# Patient Record
Sex: Male | Born: 1970 | Race: White | Hispanic: No | Marital: Married | Smoking: Former smoker
Health system: Southern US, Community
[De-identification: ages and names within clinical notes are randomized; demographics above are authoritative.]

## PROBLEM LIST (undated history)

## (undated) DIAGNOSIS — I10 Essential (primary) hypertension: Secondary | ICD-10-CM

## (undated) DIAGNOSIS — N529 Male erectile dysfunction, unspecified: Secondary | ICD-10-CM

## (undated) HISTORY — PX: NO PAST SURGERIES: SHX2092

## (undated) HISTORY — DX: Male erectile dysfunction, unspecified: N52.9

## (undated) HISTORY — DX: Essential (primary) hypertension: I10

---

## 2016-03-06 ENCOUNTER — Ambulatory Visit (INDEPENDENT_AMBULATORY_CARE_PROVIDER_SITE_OTHER): Payer: Self-pay | Admitting: Internal Medicine

## 2016-03-06 VITALS — BP 153/113 | HR 75 | Temp 98.4°F | Resp 20 | Ht 73.0 in | Wt 191.8 lb

## 2016-03-06 DIAGNOSIS — R030 Elevated blood-pressure reading, without diagnosis of hypertension: Secondary | ICD-10-CM

## 2016-03-06 DIAGNOSIS — IMO0001 Reserved for inherently not codable concepts without codable children: Secondary | ICD-10-CM

## 2016-03-06 NOTE — Progress Notes (Addendum)
Subjective:  By signing my name below, I, Moises Blood, attest that this documentation has been prepared under the direction and in the presence of Tami Lin, MD. Electronically Signed: Moises Blood, [place]. 03/06/2016 , 7:26 PM .  Patient was seen in Room 14 .   Patient ID: Luis Ward, male    DOB: 24-May-1970, 46 y.o.   MRN: [id number] Chief Complaint  Patient presents with  . Hypertension    today   HPI Luis Ward is a 46 y.o. male who presents to [facility] complaining of elevated blood pressure noted today. He went to the [facility] today and his BP was elevated. His girlfriend was concerned and used another machine to check. It was still elevated so she told the patient to come in to be seen. He denies any chest pain, shortness of breath or headache. He denies any exercise. He denies eating too much salt in his diet. He mentions eating a mostly [place] with his girlfriend. His weight has remained stable over the years. He has been relatively sedentary illness 2 years  He was seen here 6-7 years ago for similar problem with elevated blood pressure which proved to be normotensive and follow-up and so no medications were started. These medical records have not been reviewed  He has family history where his father had HTN and heart attacks, 3 bypass surgeries who died at 35 during a surgery.  His sister has asthma.   There are no active problems to display for this patient.  No current outpatient prescriptions on file.  Review of Systems  Constitutional: Negative for fever, chills and fatigue.  Respiratory: Negative for cough, shortness of breath and wheezing.   Cardiovascular: Negative for chest pain and palpitations.  Gastrointestinal: Negative for nausea, vomiting, abdominal pain and diarrhea.  Neurological: Negative for dizziness, weakness, light-headedness, numbness and headaches.      Objective:   Physical Exam  Constitutional: He is oriented to person, place, and  time. He appears well-developed and well-nourished. No distress.  HENT:  Head: Normocephalic and atraumatic.  Eyes: EOM are normal. Pupils are equal, round, and reactive to light.  Neck: Neck supple. No thyromegaly present.  Cardiovascular: Normal rate, regular rhythm and normal heart sounds.   No murmur heard. Pulmonary/Chest: Effort normal. No respiratory distress.  Musculoskeletal: Normal range of motion. He exhibits no edema.  Neurological: He is alert and oriented to person, place, and time.  Skin: Skin is warm and dry.  Psychiatric: He has a normal mood and affect. His behavior is normal.  Nursing note and vitals reviewed.  BP 153/113 mmHg  Pulse 75  Temp(Src) 98.4 F (36.9 C) (Oral)  Resp 20  Ht 6\' 1"  (1.854 m)  Wt 191 lb 12.8 oz (87 kg)  BMI 25.31 kg/m2  SpO2 99%    Assessment & Plan:  Problem #1 elevated blood pressure without diagnosis of hypertension  He is arguing against starting medication today. He would like to take the past of non-intervention as much as possible. I suggested that he get a home blood pressure cuff and do daily blood pressures 2-4 weeks and then send me the results - my chart. If elevated we will start a medication and pursue low-salt diet with physical conditioning program. If he can't sustain activity increase his fitness level he will get a stress test Review old records   I have completed the patient encounter in its entirety as documented by the scribe, with editing by me where necessary. Robert P.  Laney Pastor, M.D.  Addend 03/10/16 Review of old paper chart shows no consistent htn with random visits over several yrs Labs ok x ldl 125

## 2016-03-07 ENCOUNTER — Encounter: Payer: Self-pay | Admitting: Internal Medicine

## 2016-03-07 DIAGNOSIS — IMO0001 Reserved for inherently not codable concepts without codable children: Secondary | ICD-10-CM | POA: Insufficient documentation

## 2016-03-07 DIAGNOSIS — R030 Elevated blood-pressure reading, without diagnosis of hypertension: Principal | ICD-10-CM

## 2016-03-08 ENCOUNTER — Telehealth: Payer: Self-pay

## 2016-03-08 NOTE — Telephone Encounter (Signed)
IN your box

## 2016-03-08 NOTE — Telephone Encounter (Signed)
-----   Message from Leandrew Koyanagi, MD sent at 03/06/2016  7:28 PM EST ----- Please pull his paper chart and leave for me to review

## 2017-08-06 ENCOUNTER — Encounter: Payer: Self-pay | Admitting: Family Medicine

## 2017-08-19 ENCOUNTER — Encounter: Payer: Self-pay | Admitting: Emergency Medicine

## 2017-08-19 ENCOUNTER — Ambulatory Visit (INDEPENDENT_AMBULATORY_CARE_PROVIDER_SITE_OTHER): Payer: Self-pay | Admitting: Emergency Medicine

## 2017-08-19 VITALS — BP 184/114 | HR 86 | Temp 98.8°F | Resp 18 | Ht 73.47 in | Wt 192.2 lb

## 2017-08-19 DIAGNOSIS — I10 Essential (primary) hypertension: Secondary | ICD-10-CM

## 2017-08-19 LAB — POCT CBC
Granulocyte percent: 59.1 %G (ref 37–80)
HCT, POC: 43.2 % — AB (ref 43.5–53.7)
HEMOGLOBIN: 14.6 g/dL (ref 14.1–18.1)
LYMPH, POC: 2.6 (ref 0.6–3.4)
MCH: 32.2 pg — AB (ref 27–31.2)
MCHC: 33.9 g/dL (ref 31.8–35.4)
MCV: 94.9 fL (ref 80–97)
MID (cbc): 0.3 (ref 0–0.9)
MPV: 7.3 fL (ref 0–99.8)
PLATELET COUNT, POC: 254 10*3/uL (ref 142–424)
POC Granulocyte: 4.3 (ref 2–6.9)
POC LYMPH PERCENT: 36.2 %L (ref 10–50)
POC MID %: 4.7 %M (ref 0–12)
RBC: 4.55 M/uL — AB (ref 4.69–6.13)
RDW, POC: 13 %
WBC: 7.3 10*3/uL (ref 4.6–10.2)

## 2017-08-19 MED ORDER — LISINOPRIL 10 MG PO TABS: 10.0000 mg | ORAL_TABLET | Freq: Every day | ORAL | 3 refills | Status: DC

## 2017-08-19 NOTE — Progress Notes (Signed)
Luis Ward 47 y.o.   Chief Complaint  Patient presents with  . Establish Care  . Hypertension    HISTORY OF PRESENT ILLNESS: This is a 47 y.o. male with h/o HTN but no meds; reluctant to use meds; wants to establish care. No complaints. States BP at home: 150-160's/90-100's range.  HPI   Prior to Admission medications   Medication Sig Start Date End Date Taking? Authorizing Provider  lisinopril (PRINIVIL,ZESTRIL) 10 MG tablet Take 1 tablet (10 mg total) by mouth daily. 08/19/17   Horald Pollen, MD    No Known Allergies  Patient Active Problem List   Diagnosis Date Noted  . Elevated blood pressure 03/07/2016    History reviewed. No pertinent past medical history.  History reviewed. No pertinent surgical history.  Social History   Social History  . Marital status: Married    Spouse name: N/A  . Number of children: N/A  . Years of education: N/A   Occupational History  . Not on file.   Social History Main Topics  . Smoking status: Never Smoker  . Smokeless tobacco: Never Used  . Alcohol use 0.0 oz/week  . Drug use: No  . Sexual activity: Yes   Other Topics Concern  . Not on file   Social History Narrative  . No narrative on file    Family History  Problem Relation Age of Onset  . Heart disease Father   . Hyperlipidemia Father   . Hypertension Father      Review of Systems  Constitutional: Negative.  Negative for chills and fever.  HENT: Negative.   Eyes: Negative.  Negative for blurred vision and double vision.  Respiratory: Negative.  Negative for shortness of breath.   Cardiovascular: Negative.  Negative for chest pain and palpitations.  Gastrointestinal: Negative.  Negative for abdominal pain, diarrhea, nausea and vomiting.  Genitourinary: Negative.  Negative for dysuria and hematuria.  Musculoskeletal: Negative.   Skin: Negative.  Negative for rash.  Neurological: Negative.  Negative for dizziness and headaches.    Endo/Heme/Allergies: Negative.   All other systems reviewed and are negative.    Vitals:   08/19/17 1628  BP: (!) 184/114  Pulse: 86  Resp: 18  Temp: 98.8 F (37.1 C)  SpO2: 97%     Physical Exam  Constitutional: He is oriented to person, place, and time. He appears well-developed and well-nourished.  HENT:  Head: Normocephalic and atraumatic.  Right Ear: External ear normal.  Left Ear: External ear normal.  Nose: Nose normal.  Mouth/Throat: Oropharynx is clear and moist.  Eyes: Pupils are equal, round, and reactive to light. Conjunctivae and EOM are normal.  Neck: Normal range of motion. Neck supple. No JVD present. No thyromegaly present.  Cardiovascular: Normal rate, regular rhythm, normal heart sounds and intact distal pulses.   Pulmonary/Chest: Effort normal and breath sounds normal.  Abdominal: Soft. Bowel sounds are normal. He exhibits no distension. There is no tenderness.  Musculoskeletal: Normal range of motion.  Lymphadenopathy:    He has no cervical adenopathy.  Neurological: He is alert and oriented to person, place, and time. No sensory deficit. He exhibits normal muscle tone.  Skin: Skin is warm and dry. Capillary refill takes less than 2 seconds. No rash noted.  Psychiatric: He has a normal mood and affect. His behavior is normal.  Vitals reviewed.  Results for orders placed or performed in visit on 08/19/17 (from the past 24 hour(s))  POCT CBC     Status: Abnormal  Collection Time: 08/19/17  5:16 PM  Result Value Ref Range   WBC 7.3 4.6 - 10.2 K/uL   Lymph, poc 2.6 0.6 - 3.4   POC LYMPH PERCENT 36.2 10 - 50 %L   MID (cbc) 0.3 0 - 0.9   POC MID % 4.7 0 - 12 %M   POC Granulocyte 4.3 2 - 6.9   Granulocyte percent 59.1 37 - 80 %G   RBC 4.55 (A) 4.69 - 6.13 M/uL   Hemoglobin 14.6 14.1 - 18.1 g/dL   HCT, POC 43.2 (A) 43.5 - 53.7 %   MCV 94.9 80 - 97 fL   MCH, POC 32.2 (A) 27 - 31.2 pg   MCHC 33.9 31.8 - 35.4 g/dL   RDW, POC 13.0 %   Platelet Count,  POC 254 142 - 424 K/uL   MPV 7.3 0 - 99.8 fL     ASSESSMENT & PLAN: Catarino was seen today for establish care and hypertension.  Diagnoses and all orders for this visit:  Essential hypertension -     Basic metabolic panel -     POCT CBC  Other orders -     lisinopril (PRINIVIL,ZESTRIL) 10 MG tablet; Take 1 tablet (10 mg total) by mouth daily.    Patient Instructions       IF you received an x-ray today, you will receive an invoice from [facility]. Please contact [facility] at [phone] with questions or concerns regarding your invoice.   IF you received labwork today, you will receive an invoice from [facility]. Please contact [facility] at [phone] with questions or concerns regarding your invoice.   Our billing staff will not be able to assist you with questions regarding bills from these companies.  You will be contacted with the lab results as soon as they are available. The fastest way to get your results is to activate your My Chart account. Instructions are located on the last page of this paperwork. If you have not heard from [place] regarding the results in 2 weeks, please contact this office.     DASH Eating Plan DASH stands for "Dietary Approaches to Stop Hypertension." The DASH eating plan is a healthy eating plan that has been shown to reduce high blood pressure (hypertension). It may also reduce your risk for type 2 diabetes, heart disease, and stroke. The DASH eating plan may also help with weight loss. What are tips for following this plan? General guidelines  Avoid eating more than 2,300 mg (milligrams) of salt (sodium) a day. If you have hypertension, you may need to reduce your sodium intake to 1,500 mg a day.  Limit alcohol intake to no more than 1 drink a day for nonpregnant women and 2 drinks a day for men. One drink equals 12 oz of beer, 5 oz of wine, or 1 oz of hard liquor.  Work with your health care provider to maintain a healthy  body weight or to lose weight. Ask what an ideal weight is for you.  Get at least 30 minutes of exercise that causes your heart to beat faster (aerobic exercise) most days of the week. Activities may include walking, swimming, or biking.  Work with your health care provider or diet and nutrition specialist (dietitian) to adjust your eating plan to your individual calorie needs. Reading food labels  Check food labels for the amount of sodium per serving. Choose foods with less than 5 percent of the Daily Value of sodium. Generally, foods with less than 300  mg of sodium per serving fit into this eating plan.  To find whole grains, look for the word "whole" as the first word in the ingredient list. Shopping  Buy products labeled as "low-sodium" or "no salt added."  Buy fresh foods. Avoid canned foods and premade or frozen meals. Cooking  Avoid adding salt when cooking. Use salt-free seasonings or herbs instead of table salt or sea salt. Check with your health care provider or pharmacist before using salt substitutes.  Do not fry foods. Cook foods using healthy methods such as baking, boiling, grilling, and broiling instead.  Cook with heart-healthy oils, such as olive, canola, soybean, or sunflower oil. Meal planning   Eat a balanced diet that includes: ? 5 or more servings of fruits and vegetables each day. At each meal, try to fill half of your plate with fruits and vegetables. ? Up to 6-8 servings of whole grains each day. ? Less than 6 oz of lean meat, poultry, or fish each day. A 3-oz serving of meat is about the same size as a deck of cards. One egg equals 1 oz. ? 2 servings of low-fat dairy each day. ? A serving of nuts, seeds, or beans 5 times each week. ? Heart-healthy fats. Healthy fats called Omega-3 fatty acids are found in foods such as flaxseeds and coldwater fish, like sardines, salmon, and mackerel.  Limit how much you eat of the following: ? Canned or prepackaged  foods. ? Food that is high in trans fat, such as fried foods. ? Food that is high in saturated fat, such as fatty meat. ? Sweets, desserts, sugary drinks, and other foods with added sugar. ? Full-fat dairy products.  Do not salt foods before eating.  Try to eat at least 2 vegetarian meals each week.  Eat more home-cooked food and less restaurant, buffet, and fast food.  When eating at a restaurant, ask that your food be prepared with less salt or no salt, if possible. What foods are recommended? The items listed may not be a complete list. Talk with your dietitian about what dietary choices are best for you. Grains Whole-grain or whole-wheat bread. Whole-grain or whole-wheat pasta. Brown rice. [place]. Bulgur. Whole-grain and low-sodium cereals. Pita bread. Low-fat, low-sodium crackers. Whole-wheat flour tortillas. Vegetables Fresh or frozen vegetables (raw, steamed, roasted, or grilled). Low-sodium or reduced-sodium tomato and vegetable juice. Low-sodium or reduced-sodium tomato sauce and tomato paste. Low-sodium or reduced-sodium canned vegetables. Fruits All fresh, dried, or frozen fruit. Canned fruit in natural juice (without added sugar). Meat and other protein foods Skinless chicken or [place]. Ground chicken or [place]. Pork with fat trimmed off. Fish and seafood. Egg whites. Dried beans, peas, or lentils. Unsalted nuts, nut butters, and seeds. Unsalted canned beans. Lean cuts of beef with fat trimmed off. Low-sodium, lean deli meat. Dairy Low-fat (1%) or fat-free (skim) milk. Fat-free, low-fat, or reduced-fat cheeses. Nonfat, low-sodium ricotta or cottage cheese. Low-fat or nonfat yogurt. Low-fat, low-sodium cheese. Fats and oils Soft margarine without trans fats. Vegetable oil. Low-fat, reduced-fat, or light mayonnaise and salad dressings (reduced-sodium). Canola, safflower, olive, soybean, and sunflower oils. Avocado. Seasoning and other foods Herbs. Spices. Seasoning  mixes without salt. Unsalted popcorn and pretzels. Fat-free sweets. What foods are not recommended? The items listed may not be a complete list. Talk with your dietitian about what dietary choices are best for you. Grains Baked goods made with fat, such as croissants, muffins, or some breads. Dry pasta or rice meal packs. Vegetables Creamed  or fried vegetables. Vegetables in a cheese sauce. Regular canned vegetables (not low-sodium or reduced-sodium). Regular canned tomato sauce and paste (not low-sodium or reduced-sodium). Regular tomato and vegetable juice (not low-sodium or reduced-sodium). Angie Fava. Olives. Fruits Canned fruit in a light or heavy syrup. Fried fruit. Fruit in cream or butter sauce. Meat and other protein foods Fatty cuts of meat. Ribs. Fried meat. Berniece Salines. Sausage. Bologna and other processed lunch meats. Salami. Fatback. Hotdogs. Bratwurst. Salted nuts and seeds. Canned beans with added salt. Canned or smoked fish. Whole eggs or egg yolks. Chicken or [place] with skin. Dairy Whole or 2% milk, cream, and half-and-half. Whole or full-fat cream cheese. Whole-fat or sweetened yogurt. Full-fat cheese. Nondairy creamers. Whipped toppings. Processed cheese and cheese spreads. Fats and oils Butter. Stick margarine. Lard. Shortening. Ghee. Bacon fat. Tropical oils, such as coconut, palm kernel, or palm oil. Seasoning and other foods Salted popcorn and pretzels. Onion salt, garlic salt, seasoned salt, table salt, and sea salt. Worcestershire sauce. Tartar sauce. Barbecue sauce. Teriyaki sauce. Soy sauce, including reduced-sodium. Steak sauce. Canned and packaged gravies. Fish sauce. Oyster sauce. Cocktail sauce. Horseradish that you find on the shelf. Ketchup. Mustard. Meat flavorings and tenderizers. Bouillon cubes. Hot sauce and Tabasco sauce. Premade or packaged marinades. Premade or packaged taco seasonings. Relishes. Regular salad dressings. Where to find more information:  National  Heart, Lung, and [place]: https://wilson-eaton.com/  American Heart Association: www.heart.org Summary  The DASH eating plan is a healthy eating plan that has been shown to reduce high blood pressure (hypertension). It may also reduce your risk for type 2 diabetes, heart disease, and stroke.  With the DASH eating plan, you should limit salt (sodium) intake to 2,300 mg a day. If you have hypertension, you may need to reduce your sodium intake to 1,500 mg a day.  When on the DASH eating plan, aim to eat more fresh fruits and vegetables, whole grains, lean proteins, low-fat dairy, and heart-healthy fats.  Work with your health care provider or diet and nutrition specialist (dietitian) to adjust your eating plan to your individual calorie needs. This information is not intended to replace advice given to you by your health care provider. Make sure you discuss any questions you have with your health care provider. Document Released: 12/06/2011 Document Revised: 12/10/2016 Document Reviewed: 12/10/2016 Elsevier Interactive Patient Education  2017 [employer].      Agustina Caroli, MD [facility]

## 2017-08-19 NOTE — Patient Instructions (Addendum)
IF you received an x-ray today, you will receive an invoice from [facility]. Please contact [facility] at [phone] with questions or concerns regarding your invoice.   IF you received labwork today, you will receive an invoice from [place] [facility]. Please contact [facility] at [phone] with questions or concerns regarding your invoice.   Our billing staff will not be able to assist you with questions regarding bills from these companies.  You will be contacted with the lab results as soon as they are available. The fastest way to get your results is to activate your My Chart account. Instructions are located on the last page of this paperwork. If you have not heard from [place] regarding the results in 2 weeks, please contact this office.     DASH Eating Plan DASH stands for "Dietary Approaches to Stop Hypertension." The DASH eating plan is a healthy eating plan that has been shown to reduce high blood pressure (hypertension). It may also reduce your risk for type 2 diabetes, heart disease, and stroke. The DASH eating plan may also help with weight loss. What are tips for following this plan? General guidelines  Avoid eating more than 2,300 mg (milligrams) of salt (sodium) a day. If you have hypertension, you may need to reduce your sodium intake to 1,500 mg a day.  Limit alcohol intake to no more than 1 drink a day for nonpregnant women and 2 drinks a day for men. One drink equals 12 oz of beer, 5 oz of wine, or 1 oz of hard liquor.  Work with your health care provider to maintain a healthy body weight or to lose weight. Ask what an ideal weight is for you.  Get at least 30 minutes of exercise that causes your heart to beat faster (aerobic exercise) most days of the week. Activities may include walking, swimming, or biking.  Work with your health care provider or diet and nutrition specialist (dietitian) to adjust your eating plan to your individual calorie  needs. Reading food labels  Check food labels for the amount of sodium per serving. Choose foods with less than 5 percent of the Daily Value of sodium. Generally, foods with less than 300 mg of sodium per serving fit into this eating plan.  To find whole grains, look for the word "whole" as the first word in the ingredient list. Shopping  Buy products labeled as "low-sodium" or "no salt added."  Buy fresh foods. Avoid canned foods and premade or frozen meals. Cooking  Avoid adding salt when cooking. Use salt-free seasonings or herbs instead of table salt or sea salt. Check with your health care provider or pharmacist before using salt substitutes.  Do not fry foods. Cook foods using healthy methods such as baking, boiling, grilling, and broiling instead.  Cook with heart-healthy oils, such as olive, canola, soybean, or sunflower oil. Meal planning   Eat a balanced diet that includes: ? 5 or more servings of fruits and vegetables each day. At each meal, try to fill half of your plate with fruits and vegetables. ? Up to 6-8 servings of whole grains each day. ? Less than 6 oz of lean meat, poultry, or fish each day. A 3-oz serving of meat is about the same size as a deck of cards. One egg equals 1 oz. ? 2 servings of low-fat dairy each day. ? A serving of nuts, seeds, or beans 5 times each week. ? Heart-healthy fats. Healthy fats called Omega-3 fatty acids are found  in foods such as flaxseeds and coldwater fish, like sardines, salmon, and mackerel.  Limit how much you eat of the following: ? Canned or prepackaged foods. ? Food that is high in trans fat, such as fried foods. ? Food that is high in saturated fat, such as fatty meat. ? Sweets, desserts, sugary drinks, and other foods with added sugar. ? Full-fat dairy products.  Do not salt foods before eating.  Try to eat at least 2 vegetarian meals each week.  Eat more home-cooked food and less restaurant, buffet, and fast  food.  When eating at a restaurant, ask that your food be prepared with less salt or no salt, if possible. What foods are recommended? The items listed may not be a complete list. Talk with your dietitian about what dietary choices are best for you. Grains Whole-grain or whole-wheat bread. Whole-grain or whole-wheat pasta. Brown rice. [place]. Bulgur. Whole-grain and low-sodium cereals. Pita bread. Low-fat, low-sodium crackers. Whole-wheat flour tortillas. Vegetables Fresh or frozen vegetables (raw, steamed, roasted, or grilled). Low-sodium or reduced-sodium tomato and vegetable juice. Low-sodium or reduced-sodium tomato sauce and tomato paste. Low-sodium or reduced-sodium canned vegetables. Fruits All fresh, dried, or frozen fruit. Canned fruit in natural juice (without added sugar). Meat and other protein foods Skinless chicken or [place]. Ground chicken or [place]. Pork with fat trimmed off. Fish and seafood. Egg whites. Dried beans, peas, or lentils. Unsalted nuts, nut butters, and seeds. Unsalted canned beans. Lean cuts of beef with fat trimmed off. Low-sodium, lean deli meat. Dairy Low-fat (1%) or fat-free (skim) milk. Fat-free, low-fat, or reduced-fat cheeses. Nonfat, low-sodium ricotta or cottage cheese. Low-fat or nonfat yogurt. Low-fat, low-sodium cheese. Fats and oils Soft margarine without trans fats. Vegetable oil. Low-fat, reduced-fat, or light mayonnaise and salad dressings (reduced-sodium). Canola, safflower, olive, soybean, and sunflower oils. Avocado. Seasoning and other foods Herbs. Spices. Seasoning mixes without salt. Unsalted popcorn and pretzels. Fat-free sweets. What foods are not recommended? The items listed may not be a complete list. Talk with your dietitian about what dietary choices are best for you. Grains Baked goods made with fat, such as croissants, muffins, or some breads. Dry pasta or rice meal packs. Vegetables Creamed or fried vegetables. Vegetables  in a cheese sauce. Regular canned vegetables (not low-sodium or reduced-sodium). Regular canned tomato sauce and paste (not low-sodium or reduced-sodium). Regular tomato and vegetable juice (not low-sodium or reduced-sodium). Angie Fava. Olives. Fruits Canned fruit in a light or heavy syrup. Fried fruit. Fruit in cream or butter sauce. Meat and other protein foods Fatty cuts of meat. Ribs. Fried meat. Berniece Salines. Sausage. Bologna and other processed lunch meats. Salami. Fatback. Hotdogs. Bratwurst. Salted nuts and seeds. Canned beans with added salt. Canned or smoked fish. Whole eggs or egg yolks. Chicken or [place] with skin. Dairy Whole or 2% milk, cream, and half-and-half. Whole or full-fat cream cheese. Whole-fat or sweetened yogurt. Full-fat cheese. Nondairy creamers. Whipped toppings. Processed cheese and cheese spreads. Fats and oils Butter. Stick margarine. Lard. Shortening. Ghee. Bacon fat. Tropical oils, such as coconut, palm kernel, or palm oil. Seasoning and other foods Salted popcorn and pretzels. Onion salt, garlic salt, seasoned salt, table salt, and sea salt. Worcestershire sauce. Tartar sauce. Barbecue sauce. Teriyaki sauce. Soy sauce, including reduced-sodium. Steak sauce. Canned and packaged gravies. Fish sauce. Oyster sauce. Cocktail sauce. Horseradish that you find on the shelf. Ketchup. Mustard. Meat flavorings and tenderizers. Bouillon cubes. Hot sauce and Tabasco sauce. Premade or packaged marinades. Premade or packaged taco seasonings. Relishes.  and tenderizers. Bouillon cubes. Hot sauce and Tabasco sauce. Premade or packaged marinades. Premade or packaged taco seasonings. Relishes. Regular salad dressings. Where to find more information:  National Heart, Lung, and Blood Institute: www.nhlbi.nih.gov  American Heart Association: www.heart.org Summary  The DASH eating plan is a healthy eating plan that has been shown to reduce high blood pressure (hypertension). It may also reduce your risk for type 2 diabetes, heart disease, and stroke.  With the DASH eating plan, you should limit salt (sodium) intake  to 2,300 mg a day. If you have hypertension, you may need to reduce your sodium intake to 1,500 mg a day.  When on the DASH eating plan, aim to eat more fresh fruits and vegetables, whole grains, lean proteins, low-fat dairy, and heart-healthy fats.  Work with your health care provider or diet and nutrition specialist (dietitian) to adjust your eating plan to your individual calorie needs. This information is not intended to replace advice given to you by your health care provider. Make sure you discuss any questions you have with your health care provider. Document Released: 12/06/2011 Document Revised: 12/10/2016 Document Reviewed: 12/10/2016 Elsevier Interactive Patient Education  2017 Elsevier Inc.  

## 2017-08-20 LAB — BASIC METABOLIC PANEL
BUN/Creatinine Ratio: 11 (ref 9–20)
BUN: 10 mg/dL (ref 6–24)
CO2: 23 mmol/L (ref 20–29)
CREATININE: 0.9 mg/dL (ref 0.76–1.27)
Calcium: 9.6 mg/dL (ref 8.7–10.2)
Chloride: 98 mmol/L (ref 96–106)
GFR calc Af Amer: 119 mL/min/{1.73_m2} (ref >59)
GFR, EST NON AFRICAN AMERICAN: 103 mL/min/{1.73_m2} (ref >59)
Glucose: 70 mg/dL (ref 65–99)
Potassium: 4 mmol/L (ref 3.5–5.2)
SODIUM: 137 mmol/L (ref 134–144)

## 2017-11-22 ENCOUNTER — Ambulatory Visit: Payer: Self-pay | Admitting: Emergency Medicine

## 2018-01-08 ENCOUNTER — Encounter: Payer: Self-pay | Admitting: Emergency Medicine

## 2018-01-08 NOTE — Telephone Encounter (Signed)
Last visit 08/19/17 was his first visit with me. Given the symptoms and the time passed, I think it's reasonable to request Ov. Thanks.

## 2018-09-02 ENCOUNTER — Other Ambulatory Visit: Payer: Self-pay | Admitting: Emergency Medicine

## 2018-10-02 ENCOUNTER — Ambulatory Visit: Payer: Self-pay

## 2018-10-02 ENCOUNTER — Ambulatory Visit: Payer: Self-pay | Admitting: Emergency Medicine

## 2018-10-02 VITALS — BP 168/100 | HR 92 | Temp 98.7°F | Resp 14 | Ht 73.0 in | Wt 193.7 lb

## 2018-10-02 DIAGNOSIS — R10A2 Flank pain, left side: Secondary | ICD-10-CM | POA: Insufficient documentation

## 2018-10-02 DIAGNOSIS — R109 Unspecified abdominal pain: Secondary | ICD-10-CM

## 2018-10-02 DIAGNOSIS — I10 Essential (primary) hypertension: Secondary | ICD-10-CM

## 2018-10-02 DIAGNOSIS — G8929 Other chronic pain: Secondary | ICD-10-CM | POA: Insufficient documentation

## 2018-10-02 LAB — POCT URINALYSIS DIPSTICK
Bilirubin, UA: NEGATIVE
Blood, UA: NEGATIVE
GLUCOSE UA: NEGATIVE
KETONES UA: NEGATIVE
Leukocytes, UA: NEGATIVE
Nitrite, UA: NEGATIVE
Protein, UA: NEGATIVE
Spec Grav, UA: 1.025 (ref 1.010–1.025)
Urobilinogen, UA: 0.2 E.U./dL
pH, UA: 6 (ref 5.0–8.0)

## 2018-10-02 MED ORDER — LISINOPRIL-HYDROCHLOROTHIAZIDE 10-12.5 MG PO TABS: 1.0000 | ORAL_TABLET | Freq: Every day | ORAL | 1 refills | Status: DC

## 2018-10-02 NOTE — Patient Instructions (Addendum)
Please make an appointment to see Whitney at [facility] at [facility]. Please consider having a sleep study. Please decrease your alcohol intake. Decrease your salt intake. Do stretches daily regarding your left flank pain. Start regular exercise. If you develop any swelling of your tongue or lip please stop the medicine immediately and contact this office or the emergency room.  All

## 2018-10-02 NOTE — Progress Notes (Signed)
The patient has been out of his BP meds for 3 weeks. He attempted to fill his prescription at his pharmacy but was unable to. He would like to speak with the provider to possibly alter his dose or change the prescription . Subjective.    Patient was seen over one year ago and placed on lisinopril 10 mg for blood pressure control.  Basic metabolic panel checked at that time was normal.  He has been out of his medication now for at least 3 weeks.  He states his blood pressure was never under control with this medication.  He tolerated the lisinopril with the exception of a scratchy type throat cough which seemed to resolve even though he stayed on the medication.  At no time did he have any swelling or angioedema or difficulty with breathing.  He does have symptoms consistent with with sleep apnea with snoring at night and occasionally stopping his breathing.  He does not exercise.  He also has had a pain in his left flank area that he would like checked.  He is a moderate salt user and does drink a few glasses of wine at night. Objective. Neck is supple without thyromegaly. Chest clear to auscultation Heart regular rate and rhythm without murmurs. Abdomen soft liver and spleen not enlarged. Back there is tenderness along the left peri-lumbar and upper perithoracic muscles. Extremities pulses 2+ and symmetrical without lower extremity swelling. Urine dip negative Assessment. 1.  Hypertension.  Will resume blood pressure medications.  He did have a slight scratchy throat with cough on lisinopril for which resolved with time.  He was alerted to the signs and symptoms to stop this medication if he develops them.  He states he wants to try the lisinopril again because he knows he can take it.  He does need evaluation with a sleep test.  Also understands he must follow-up at primary care at [facility] in the next 2 weeks.  He will increase his exercise and decrease his salt intake. 2..  Left flank  discomfort. Urine check was negative for blood.  He will do stretches at home.  If he continues to have problems would consider x-rays of the thoracic and lumbar spine as well as ultrasound of the left upper abdomen to include the left kidney and spleen. Plan. Lisinopril HCTZ 10/12.5 one a day Decrease salt, increase exercise, decrease weight by 2 to 3 pounds, decrease alcohol intake at night. Make appointment to follow-up blood pressure in 2 weeks. Advised patient he must be consistent with his medication and have appropriate follow-up.Marland Kitchen

## 2018-12-08 ENCOUNTER — Other Ambulatory Visit: Payer: Self-pay | Admitting: Emergency Medicine

## 2018-12-08 NOTE — Telephone Encounter (Signed)
Copied from [place] [id number]. Topic: Quick Communication - Rx Refill/Question >> Dec 08, 2018  3:22 PM [place], [place] D wrote: Medication: lisinopril-hydrochlorothiazide (PRINZIDE,ZESTORETIC) 10-12.5 MG tablet / Pt called inquire about refill for BP medication. Has OV scheduled for 01/15/18 with Dr. Mitchel Honour, however pt will be out of BP medication well before then. No providers available before January. Please advise pt if refill can be done until OV. UX#[phone]    Has the patient contacted their pharmacy? Yes.   (Agent: If no, request that the patient contact the pharmacy for the refill.) (Agent: If yes, when and what did the pharmacy advise?)  Preferred Pharmacy (with phone number or street name): [facility], [place] [address] [phone] (Phone) [phone] (Fax)    Agent: Please be advised that RX refills may take up to 3 business days. We ask that you follow-up with your pharmacy.

## 2018-12-10 NOTE — Telephone Encounter (Signed)
Requested medication (s) are due for refill today: yes  Requested medication (s) are on the active medication list: yes  Last refill:  10/02/18  Outside provider  Future visit scheduled: yes 01/15/19  Notes to clinic:  Medication last refilled by outside provider 10/02/18. Pt has scheduled appointment with Dr Mitchel Honour 01/15/19. Please review    Requested Prescriptions  Pending Prescriptions Disp Refills   lisinopril-hydrochlorothiazide (PRINZIDE,ZESTORETIC) 10-12.5 MG tablet 30 tablet 1    Sig: Take 1 tablet by mouth daily.     Cardiovascular:  ACEI + Diuretic Combos Failed - 12/09/2018 12:40 PM      Failed - Na in normal range and within 180 days    Sodium  Date Value Ref Range Status  08/19/2017 137 134 - 144 mmol/L Final         Failed - K in normal range and within 180 days    Potassium  Date Value Ref Range Status  08/19/2017 4.0 3.5 - 5.2 mmol/L Final         Failed - Cr in normal range and within 180 days    Creatinine, Ser  Date Value Ref Range Status  08/19/2017 0.90 0.76 - 1.27 mg/dL Final         Failed - Ca in normal range and within 180 days    Calcium  Date Value Ref Range Status  08/19/2017 9.6 8.7 - 10.2 mg/dL Final         Failed - Last BP in normal range    BP Readings from Last 1 Encounters:  10/02/18 (!) 168/100         Failed - Valid encounter within last 6 months    Recent Outpatient Visits          1 year ago Essential hypertension   [facility], Ines Bloomer, MD   2 years ago Elevated blood pressure   [facility] at [facility], Linton Ham, MD      Future Appointments            In 1 month Sagardia, Ines Bloomer, MD Primary Care at [place] [place] - Patient is not pregnant

## 2019-01-15 ENCOUNTER — Ambulatory Visit: Payer: Managed Care, Other (non HMO) | Admitting: Emergency Medicine

## 2019-01-15 ENCOUNTER — Other Ambulatory Visit: Payer: Self-pay

## 2019-01-15 ENCOUNTER — Encounter: Payer: Self-pay | Admitting: Emergency Medicine

## 2019-01-15 VITALS — BP 176/110 | HR 77 | Temp 98.2°F | Resp 16 | Ht 73.0 in | Wt 194.2 lb

## 2019-01-15 DIAGNOSIS — I10 Essential (primary) hypertension: Secondary | ICD-10-CM | POA: Diagnosis not present

## 2019-01-15 MED ORDER — LISINOPRIL-HYDROCHLOROTHIAZIDE 10-12.5 MG PO TABS: 1.0000 | ORAL_TABLET | Freq: Every day | ORAL | 3 refills | Status: DC

## 2019-01-15 NOTE — Progress Notes (Signed)
BP Readings from Last 3 Encounters:  10/02/18 (!) 168/100  08/19/17 (!) 184/114  03/06/16 (!) 153/113   Luis Ward 49 y.o.   Chief Complaint  Patient presents with  . Medication Refill    Lisinopril-hctz    HISTORY OF PRESENT ILLNESS: This is a 49 y.o. male with history of hypertension but off his medication for the past 2 to 3 weeks.  Requesting medication refill.  Has no complaints or medical concerns today.  HPI   Prior to Admission medications   Medication Sig Start Date End Date Taking? Authorizing Provider  lisinopril-hydrochlorothiazide (PRINZIDE,ZESTORETIC) 10-12.5 MG tablet Take 1 tablet by mouth daily. 10/02/18  Yes Darlyne Russian, MD    No Known Allergies  Patient Active Problem List   Diagnosis Date Noted  . Chronic left flank pain 10/02/2018  . Elevated blood pressure 03/07/2016    No past medical history on file.  No past surgical history on file.  Social History   Socioeconomic History  . Marital status: Married    Spouse name: Not on file  . Number of children: Not on file  . Years of education: Not on file  . Highest education level: Not on file  Occupational History  . Not on file  Social Needs  . Financial resource strain: Not on file  . Food insecurity:    Worry: Not on file    Inability: Not on file  . Transportation needs:    Medical: Not on file    Non-medical: Not on file  Tobacco Use  . Smoking status: Never Smoker  . Smokeless tobacco: Never Used  Substance and Sexual Activity  . Alcohol use: Yes    Alcohol/week: 0.0 standard drinks  . Drug use: No  . Sexual activity: Yes  Lifestyle  . Physical activity:    Days per week: Not on file    Minutes per session: Not on file  . Stress: Not on file  Relationships  . Social connections:    Talks on phone: Not on file    Gets together: Not on file    Attends religious service: Not on file    Active member of club or organization: Not on file    Attends meetings of clubs or  organizations: Not on file    Relationship status: Not on file  . Intimate partner violence:    Fear of current or ex partner: Not on file    Emotionally abused: Not on file    Physically abused: Not on file    Forced sexual activity: Not on file  Other Topics Concern  . Not on file  Social History Narrative  . Not on file    Family History  Problem Relation Age of Onset  . Heart disease Father   . Hyperlipidemia Father   . Hypertension Father      Review of Systems  Constitutional: Negative.  Negative for chills and fever.  HENT: Negative.  Negative for sore throat.   Eyes: Negative.  Negative for blurred vision and double vision.  Respiratory: Negative.  Negative for cough and shortness of breath.   Cardiovascular: Negative.  Negative for chest pain and palpitations.  Gastrointestinal: Negative for abdominal pain, diarrhea, nausea and vomiting.  Genitourinary: Negative.   Musculoskeletal: Negative.  Negative for back pain, myalgias and neck pain.  Skin: Negative.  Negative for rash.  Neurological: Negative.  Negative for dizziness, sensory change, focal weakness, loss of consciousness and headaches.  Endo/Heme/Allergies: Negative.   All  other systems reviewed and are negative.   Vitals:   01/15/19 0822  BP: (!) 162/105  Pulse: 77  Resp: 16  Temp: 98.2 F (36.8 C)  SpO2: 96%    Physical Exam Vitals signs reviewed.  Constitutional:      Appearance: Normal appearance.  HENT:     Head: Normocephalic and atraumatic.     Mouth/Throat:     Mouth: Mucous membranes are moist.     Pharynx: Oropharynx is clear.  Eyes:     Extraocular Movements: Extraocular movements intact.     Pupils: Pupils are equal, round, and reactive to light.  Neck:     Musculoskeletal: Normal range of motion.     Vascular: No carotid bruit.  Cardiovascular:     Rate and Rhythm: Normal rate and regular rhythm.  Pulmonary:     Effort: Pulmonary effort is normal.     Breath sounds: Normal  breath sounds.  Abdominal:     General: There is no distension.     Palpations: Abdomen is soft. There is no mass.     Tenderness: There is no abdominal tenderness.  Musculoskeletal: Normal range of motion.  Lymphadenopathy:     Cervical: No cervical adenopathy.  Skin:    General: Skin is warm and dry.  Neurological:     General: No focal deficit present.     Mental Status: He is alert and oriented to person, place, and time.     Sensory: No sensory deficit.     Motor: No weakness.  Psychiatric:        Mood and Affect: Mood normal.        Behavior: Behavior normal.      ASSESSMENT & PLAN: Luis Ward was seen today for medication refill.  Diagnoses and all orders for this visit:  Essential hypertension -     Lipid panel -     Comprehensive metabolic panel -     CBC with Differential/Platelet -     lisinopril-hydrochlorothiazide (PRINZIDE,ZESTORETIC) 10-12.5 MG tablet; Take 1 tablet by mouth daily.  Uncontrolled hypertension    Patient Instructions       If you have lab work done today you will be contacted with your lab results within the next 2 weeks.  If you have not heard from [place] then please contact us. The fastest way to get your results is to register for My Chart.   IF you received an x-ray today, you will receive an invoice from [facility]. Please contact [facility] at [phone] with questions or concerns regarding your invoice.   IF you received labwork today, you will receive an invoice from [facility]. Please contact [facility] at [phone] with questions or concerns regarding your invoice.   Our billing staff will not be able to assist you with questions regarding bills from these companies.  You will be contacted with the lab results as soon as they are available. The fastest way to get your results is to activate your My Chart account. Instructions are located on the last page of this paperwork. If you have not heard from [place] regarding  the results in 2 weeks, please contact this office.     Hypertension Hypertension, commonly called high blood pressure, is when the force of blood pumping through the arteries is too strong. The arteries are the blood vessels that carry blood from the heart throughout the body. Hypertension forces the heart to work harder to pump blood and may cause arteries to become narrow or stiff. Having  untreated or uncontrolled hypertension can cause heart attacks, strokes, kidney disease, and other problems. A blood pressure reading consists of a higher number over a lower number. Ideally, your blood pressure should be below 120/80. The first ("top") number is called the systolic pressure. It is a measure of the pressure in your arteries as your heart beats. The second ("bottom") number is called the diastolic pressure. It is a measure of the pressure in your arteries as the heart relaxes. What are the causes? The cause of this condition is not known. What increases the risk? Some risk factors for high blood pressure are under your control. Others are not. Factors you can change  Smoking.  Having type 2 diabetes mellitus, high cholesterol, or both.  Not getting enough exercise or physical activity.  Being overweight.  Having too much fat, sugar, calories, or salt (sodium) in your diet.  Drinking too much alcohol. Factors that are difficult or impossible to change  Having chronic kidney disease.  Having a family history of high blood pressure.  Age. Risk increases with age.  Race. You may be at higher risk if you are African-American.  Gender. Men are at higher risk than women before age 43. After age 62, women are at higher risk than men.  Having obstructive sleep apnea.  Stress. What are the signs or symptoms? Extremely high blood pressure (hypertensive crisis) may cause:  Headache.  Anxiety.  Shortness of breath.  Nosebleed.  Nausea and vomiting.  Severe chest  pain.  Jerky movements you cannot control (seizures). How is this diagnosed? This condition is diagnosed by measuring your blood pressure while you are seated, with your arm resting on a surface. The cuff of the blood pressure monitor will be placed directly against the skin of your upper arm at the level of your heart. It should be measured at least twice using the same arm. Certain conditions can cause a difference in blood pressure between your right and left arms. Certain factors can cause blood pressure readings to be lower or higher than normal (elevated) for a short period of time:  When your blood pressure is higher when you are in a health care provider's office than when you are at home, this is called white coat hypertension. Most people with this condition do not need medicines.  When your blood pressure is higher at home than when you are in a health care provider's office, this is called masked hypertension. Most people with this condition may need medicines to control blood pressure. If you have a high blood pressure reading during one visit or you have normal blood pressure with other risk factors:  You may be asked to return on a different day to have your blood pressure checked again.  You may be asked to monitor your blood pressure at home for 1 week or longer. If you are diagnosed with hypertension, you may have other blood or imaging tests to help your health care provider understand your overall risk for other conditions. How is this treated? This condition is treated by making healthy lifestyle changes, such as eating healthy foods, exercising more, and reducing your alcohol intake. Your health care provider may prescribe medicine if lifestyle changes are not enough to get your blood pressure under control, and if:  Your systolic blood pressure is above 130.  Your diastolic blood pressure is above 80. Your personal target blood pressure may vary depending on your medical  conditions, your age, and other factors. Follow these instructions  at home: Eating and drinking   Eat a diet that is high in fiber and potassium, and low in sodium, added sugar, and fat. An example eating plan is called the DASH (Dietary Approaches to Stop Hypertension) diet. To eat this way: ? Eat plenty of fresh fruits and vegetables. Try to fill half of your plate at each meal with fruits and vegetables. ? Eat whole grains, such as whole wheat pasta, brown rice, or whole grain bread. Fill about one quarter of your plate with whole grains. ? Eat or drink low-fat dairy products, such as skim milk or low-fat yogurt. ? Avoid fatty cuts of meat, processed or cured meats, and poultry with skin. Fill about one quarter of your plate with lean proteins, such as fish, chicken without skin, beans, eggs, and tofu. ? Avoid premade and processed foods. These tend to be higher in sodium, added sugar, and fat.  Reduce your daily sodium intake. Most people with hypertension should eat less than 1,500 mg of sodium a day.  Limit alcohol intake to no more than 1 drink a day for nonpregnant women and 2 drinks a day for men. One drink equals 12 oz of beer, 5 oz of wine, or 1 oz of hard liquor. Lifestyle   Work with your health care provider to maintain a healthy body weight or to lose weight. Ask what an ideal weight is for you.  Get at least 30 minutes of exercise that causes your heart to beat faster (aerobic exercise) most days of the week. Activities may include walking, swimming, or biking.  Include exercise to strengthen your muscles (resistance exercise), such as pilates or lifting weights, as part of your weekly exercise routine. Try to do these types of exercises for 30 minutes at least 3 days a week.  Do not use any products that contain nicotine or tobacco, such as cigarettes and e-cigarettes. If you need help quitting, ask your health care provider.  Monitor your blood pressure at home as told by  your health care provider.  Keep all follow-up visits as told by your health care provider. This is important. Medicines  Take over-the-counter and prescription medicines only as told by your health care provider. Follow directions carefully. Blood pressure medicines must be taken as prescribed.  Do not skip doses of blood pressure medicine. Doing this puts you at risk for problems and can make the medicine less effective.  Ask your health care provider about side effects or reactions to medicines that you should watch for. Contact a health care provider if:  You think you are having a reaction to a medicine you are taking.  You have headaches that keep coming back (recurring).  You feel dizzy.  You have swelling in your ankles.  You have trouble with your vision. Get help right away if:  You develop a severe headache or confusion.  You have unusual weakness or numbness.  You feel faint.  You have severe pain in your chest or abdomen.  You vomit repeatedly.  You have trouble breathing. Summary  Hypertension is when the force of blood pumping through your arteries is too strong. If this condition is not controlled, it may put you at risk for serious complications.  Your personal target blood pressure may vary depending on your medical conditions, your age, and other factors. For most people, a normal blood pressure is less than 120/80.  Hypertension is treated with lifestyle changes, medicines, or a combination of both. Lifestyle changes include weight loss,  eating a healthy, low-sodium diet, exercising more, and limiting alcohol. This information is not intended to replace advice given to you by your health care provider. Make sure you discuss any questions you have with your health care provider. Document Released: 12/17/2005 Document Revised: 11/14/2016 Document Reviewed: 11/14/2016 Elsevier Interactive Patient Education  2019 [employer] Inc.      Agustina Caroli,  MD [facility]

## 2019-01-15 NOTE — Patient Instructions (Addendum)
   If you have lab work done today you will be contacted with your lab results within the next 2 weeks.  If you have not heard from us then please contact us. The fastest way to get your results is to register for My Chart.   IF you received an x-ray today, you will receive an invoice from [facility]. Please contact [facility] at [phone] with questions or concerns regarding your invoice.   IF you received labwork today, you will receive an invoice from [facility]. Please contact [facility] at [phone] with questions or concerns regarding your invoice.   Our billing staff will not be able to assist you with questions regarding bills from these companies.  You will be contacted with the lab results as soon as they are available. The fastest way to get your results is to activate your My Chart account. Instructions are located on the last page of this paperwork. If you have not heard from us regarding the results in 2 weeks, please contact this office.       Hypertension Hypertension, commonly called high blood pressure, is when the force of blood pumping through the arteries is too strong. The arteries are the blood vessels that carry blood from the heart throughout the body. Hypertension forces the heart to work harder to pump blood and may cause arteries to become narrow or stiff. Having untreated or uncontrolled hypertension can cause heart attacks, strokes, kidney disease, and other problems. A blood pressure reading consists of a higher number over a lower number. Ideally, your blood pressure should be below 120/80. The first ("top") number is called the systolic pressure. It is a measure of the pressure in your arteries as your heart beats. The second ("bottom") number is called the diastolic pressure. It is a measure of the pressure in your arteries as the heart relaxes. What are the causes? The cause of this condition is not known. What increases the  risk? Some risk factors for high blood pressure are under your control. Others are not. Factors you can change  Smoking.  Having type 2 diabetes mellitus, high cholesterol, or both.  Not getting enough exercise or physical activity.  Being overweight.  Having too much fat, sugar, calories, or salt (sodium) in your diet.  Drinking too much alcohol. Factors that are difficult or impossible to change  Having chronic kidney disease.  Having a family history of high blood pressure.  Age. Risk increases with age.  Race. You may be at higher risk if you are African-American.  Gender. Men are at higher risk than women before age 45. After age 65, women are at higher risk than men.  Having obstructive sleep apnea.  Stress. What are the signs or symptoms? Extremely high blood pressure (hypertensive crisis) may cause:  Headache.  Anxiety.  Shortness of breath.  Nosebleed.  Nausea and vomiting.  Severe chest pain.  Jerky movements you cannot control (seizures). How is this diagnosed? This condition is diagnosed by measuring your blood pressure while you are seated, with your arm resting on a surface. The cuff of the blood pressure monitor will be placed directly against the skin of your upper arm at the level of your heart. It should be measured at least twice using the same arm. Certain conditions can cause a difference in blood pressure between your right and left arms. Certain factors can cause blood pressure readings to be lower or higher than normal (elevated) for a short period of time:    When your blood pressure is higher when you are in a health care provider's office than when you are at home, this is called white coat hypertension. Most people with this condition do not need medicines.  When your blood pressure is higher at home than when you are in a health care provider's office, this is called masked hypertension. Most people with this condition may need medicines  to control blood pressure. If you have a high blood pressure reading during one visit or you have normal blood pressure with other risk factors:  You may be asked to return on a different day to have your blood pressure checked again.  You may be asked to monitor your blood pressure at home for 1 week or longer. If you are diagnosed with hypertension, you may have other blood or imaging tests to help your health care provider understand your overall risk for other conditions. How is this treated? This condition is treated by making healthy lifestyle changes, such as eating healthy foods, exercising more, and reducing your alcohol intake. Your health care provider may prescribe medicine if lifestyle changes are not enough to get your blood pressure under control, and if:  Your systolic blood pressure is above 130.  Your diastolic blood pressure is above 80. Your personal target blood pressure may vary depending on your medical conditions, your age, and other factors. Follow these instructions at home: Eating and drinking   Eat a diet that is high in fiber and potassium, and low in sodium, added sugar, and fat. An example eating plan is called the DASH (Dietary Approaches to Stop Hypertension) diet. To eat this way: ? Eat plenty of fresh fruits and vegetables. Try to fill half of your plate at each meal with fruits and vegetables. ? Eat whole grains, such as whole wheat pasta, brown rice, or whole grain bread. Fill about one quarter of your plate with whole grains. ? Eat or drink low-fat dairy products, such as skim milk or low-fat yogurt. ? Avoid fatty cuts of meat, processed or cured meats, and poultry with skin. Fill about one quarter of your plate with lean proteins, such as fish, chicken without skin, beans, eggs, and tofu. ? Avoid premade and processed foods. These tend to be higher in sodium, added sugar, and fat.  Reduce your daily sodium intake. Most people with hypertension should  eat less than 1,500 mg of sodium a day.  Limit alcohol intake to no more than 1 drink a day for nonpregnant women and 2 drinks a day for men. One drink equals 12 oz of beer, 5 oz of wine, or 1 oz of hard liquor. Lifestyle   Work with your health care provider to maintain a healthy body weight or to lose weight. Ask what an ideal weight is for you.  Get at least 30 minutes of exercise that causes your heart to beat faster (aerobic exercise) most days of the week. Activities may include walking, swimming, or biking.  Include exercise to strengthen your muscles (resistance exercise), such as pilates or lifting weights, as part of your weekly exercise routine. Try to do these types of exercises for 30 minutes at least 3 days a week.  Do not use any products that contain nicotine or tobacco, such as cigarettes and e-cigarettes. If you need help quitting, ask your health care provider.  Monitor your blood pressure at home as told by your health care provider.  Keep all follow-up visits as told by your health care provider.   This is important. Medicines  Take over-the-counter and prescription medicines only as told by your health care provider. Follow directions carefully. Blood pressure medicines must be taken as prescribed.  Do not skip doses of blood pressure medicine. Doing this puts you at risk for problems and can make the medicine less effective.  Ask your health care provider about side effects or reactions to medicines that you should watch for. Contact a health care provider if:  You think you are having a reaction to a medicine you are taking.  You have headaches that keep coming back (recurring).  You feel dizzy.  You have swelling in your ankles.  You have trouble with your vision. Get help right away if:  You develop a severe headache or confusion.  You have unusual weakness or numbness.  You feel faint.  You have severe pain in your chest or abdomen.  You vomit  repeatedly.  You have trouble breathing. Summary  Hypertension is when the force of blood pumping through your arteries is too strong. If this condition is not controlled, it may put you at risk for serious complications.  Your personal target blood pressure may vary depending on your medical conditions, your age, and other factors. For most people, a normal blood pressure is less than 120/80.  Hypertension is treated with lifestyle changes, medicines, or a combination of both. Lifestyle changes include weight loss, eating a healthy, low-sodium diet, exercising more, and limiting alcohol. This information is not intended to replace advice given to you by your health care provider. Make sure you discuss any questions you have with your health care provider. Document Released: 12/17/2005 Document Revised: 11/14/2016 Document Reviewed: 11/14/2016 Elsevier Interactive Patient Education  2019 [employer] Inc.  

## 2019-01-16 ENCOUNTER — Encounter: Payer: Self-pay | Admitting: *Deleted

## 2019-01-16 LAB — LIPID PANEL
CHOL/HDL RATIO: 3.7 ratio (ref 0.0–5.0)
Cholesterol, Total: 237 mg/dL — ABNORMAL HIGH (ref 100–199)
HDL: 64 mg/dL (ref >39)
LDL Calculated: 145 mg/dL — ABNORMAL HIGH (ref 0–99)
Triglycerides: 142 mg/dL (ref 0–149)
VLDL CHOLESTEROL CAL: 28 mg/dL (ref 5–40)

## 2019-01-16 LAB — CBC WITH DIFFERENTIAL/PLATELET
BASOS ABS: 0.1 10*3/uL (ref 0.0–0.2)
Basos: 1 %
EOS (ABSOLUTE): 0.1 10*3/uL (ref 0.0–0.4)
Eos: 2 %
Hematocrit: 44.3 % (ref 37.5–51.0)
Hemoglobin: 14.9 g/dL (ref 13.0–17.7)
IMMATURE GRANULOCYTES: 0 %
Immature Grans (Abs): 0 10*3/uL (ref 0.0–0.1)
LYMPHS: 39 %
Lymphocytes Absolute: 2.1 10*3/uL (ref 0.7–3.1)
MCH: 32.6 pg (ref 26.6–33.0)
MCHC: 33.6 g/dL (ref 31.5–35.7)
MCV: 97 fL (ref 79–97)
Monocytes Absolute: 0.5 10*3/uL (ref 0.1–0.9)
Monocytes: 9 %
NEUTROS PCT: 49 %
Neutrophils Absolute: 2.7 10*3/uL (ref 1.4–7.0)
PLATELETS: 257 10*3/uL (ref 150–450)
RBC: 4.57 x10E6/uL (ref 4.14–5.80)
RDW: 13.1 % (ref 11.6–15.4)
WBC: 5.5 10*3/uL (ref 3.4–10.8)

## 2019-01-16 LAB — COMPREHENSIVE METABOLIC PANEL
ALT: 30 IU/L (ref 0–44)
AST: 27 IU/L (ref 0–40)
Albumin/Globulin Ratio: 1.5 (ref 1.2–2.2)
Albumin: 4.6 g/dL (ref 3.5–5.5)
Alkaline Phosphatase: 46 IU/L (ref 39–117)
BILIRUBIN TOTAL: 0.3 mg/dL (ref 0.0–1.2)
BUN/Creatinine Ratio: 11 (ref 9–20)
BUN: 9 mg/dL (ref 6–24)
CHLORIDE: 101 mmol/L (ref 96–106)
CO2: 20 mmol/L (ref 20–29)
Calcium: 9.6 mg/dL (ref 8.7–10.2)
Creatinine, Ser: 0.84 mg/dL (ref 0.76–1.27)
GFR calc Af Amer: 121 mL/min/{1.73_m2} (ref >59)
GFR calc non Af Amer: 104 mL/min/{1.73_m2} (ref >59)
Globulin, Total: 3 g/dL (ref 1.5–4.5)
Glucose: 77 mg/dL (ref 65–99)
POTASSIUM: 4.6 mmol/L (ref 3.5–5.2)
Sodium: 138 mmol/L (ref 134–144)
Total Protein: 7.6 g/dL (ref 6.0–8.5)

## 2019-01-16 NOTE — Progress Notes (Signed)
Letter sent.

## 2019-07-22 ENCOUNTER — Ambulatory Visit: Payer: Managed Care, Other (non HMO) | Admitting: Emergency Medicine

## 2019-07-29 ENCOUNTER — Other Ambulatory Visit: Payer: Self-pay

## 2019-07-29 ENCOUNTER — Encounter: Payer: Self-pay | Admitting: Emergency Medicine

## 2019-07-29 ENCOUNTER — Ambulatory Visit: Payer: Managed Care, Other (non HMO) | Admitting: Emergency Medicine

## 2019-07-29 VITALS — BP 122/79 | HR 70 | Temp 98.5°F | Resp 16 | Ht 73.0 in | Wt 188.6 lb

## 2019-07-29 DIAGNOSIS — I10 Essential (primary) hypertension: Secondary | ICD-10-CM

## 2019-07-29 DIAGNOSIS — R29818 Other symptoms and signs involving the nervous system: Secondary | ICD-10-CM

## 2019-07-29 MED ORDER — LISINOPRIL-HYDROCHLOROTHIAZIDE 10-12.5 MG PO TABS: 1.0000 | ORAL_TABLET | Freq: Every day | ORAL | 3 refills | Status: DC

## 2019-07-29 NOTE — Progress Notes (Signed)
BP Readings from Last 3 Encounters:  07/29/19 122/79  01/15/19 (!) 176/110  10/02/18 (!) 168/100   Luis Ward 49 y.o.   Chief Complaint  Patient presents with  . Medication Refill    Lisinopril-hctz    HISTORY OF PRESENT ILLNESS: This is a 49 y.o. male with history of hypertension here for follow-up and medication refill.  Doing well compliant with medication.  Has no complaints or medical concerns today. Wife concerned about sleep apnea.  Patient snores and she has noticed breathing issues during his sleep.  HPI   Prior to Admission medications   Medication Sig Start Date End Date Taking? Authorizing Provider  lisinopril-hydrochlorothiazide (PRINZIDE,ZESTORETIC) 10-12.5 MG tablet Take 1 tablet by mouth daily. 01/15/19 04/15/19  Horald Pollen, MD    No Known Allergies  Patient Active Problem List   Diagnosis Date Noted  . Essential hypertension 01/15/2019  . 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 No pertinent past medical history.  History reviewed. No pertinent surgical history.  Social History   Socioeconomic History  . Marital status: Married    Spouse name: Not on file  . Number of children: Not on file  . Years of education: Not on file  . Highest education level: Not on file  Occupational History  . Not on file  Social Needs  . Financial resource strain: Not on file  . Food insecurity    Worry: Not on file    Inability: Not on file  . Transportation needs    Medical: Not on file    Non-medical: Not on file  Tobacco Use  . Smoking status: Never Smoker  . Smokeless tobacco: Never Used  Substance and Sexual Activity  . Alcohol use: Yes    Alcohol/week: 0.0 standard drinks  . Drug use: No  . Sexual activity: Yes  Lifestyle  . Physical activity    Days per week: Not on file    Minutes per session: Not on file  . Stress: Not on file  Relationships  . Social Herbalist on phone: Not on file    Gets together:  Not on file    Attends religious service: Not on file    Active member of club or organization: Not on file    Attends meetings of clubs or organizations: Not on file    Relationship status: Not on file  . Intimate partner violence    Fear of current or ex partner: Not on file    Emotionally abused: Not on file    Physically abused: Not on file    Forced sexual activity: Not on file  Other Topics Concern  . Not on file  Social History Narrative  . Not on file    Family History  Problem Relation Age of Onset  . Heart disease Father   . Hyperlipidemia Father   . Hypertension Father      Review of Systems  Constitutional: Negative.  Negative for chills and fever.  HENT: Negative.  Negative for congestion and sore throat.   Eyes: Negative.   Respiratory: Negative for cough and shortness of breath.        Snoring.  Cardiovascular: Negative.  Negative for chest pain and palpitations.  Gastrointestinal: Negative.  Negative for abdominal pain, diarrhea, nausea and vomiting.  Genitourinary: Negative.  Negative for dysuria.  Musculoskeletal: Negative.   Skin: Negative.  Negative for rash.  Neurological: Negative.  Negative for dizziness and headaches.  All other  systems reviewed and are negative.  Vitals:   07/29/19 1703  BP: 122/79  Pulse: 70  Resp: 16  Temp: 98.5 F (36.9 C)  SpO2: 99%     Physical Exam Vitals signs reviewed.  HENT:     Head: Normocephalic and atraumatic.  Eyes:     Extraocular Movements: Extraocular movements intact.     Conjunctiva/sclera: Conjunctivae normal.     Pupils: Pupils are equal, round, and reactive to light.  Neck:     Musculoskeletal: Normal range of motion and neck supple.  Cardiovascular:     Rate and Rhythm: Normal rate and regular rhythm.     Heart sounds: Normal heart sounds.  Pulmonary:     Effort: Pulmonary effort is normal.     Breath sounds: Normal breath sounds.  Abdominal:     General: There is no distension.      Palpations: Abdomen is soft.     Tenderness: There is no abdominal tenderness.  Musculoskeletal: Normal range of motion.  Skin:    General: Skin is warm and dry.     Capillary Refill: Capillary refill takes less than 2 seconds.  Neurological:     General: No focal deficit present.     Mental Status: He is alert and oriented to person, place, and time.  Psychiatric:        Mood and Affect: Mood normal.        Behavior: Behavior normal.      ASSESSMENT & PLAN: Luis Ward was seen today for medication refill.  Diagnoses and all orders for this visit:  Essential hypertension -     lisinopril-hydrochlorothiazide (ZESTORETIC) 10-12.5 MG tablet; Take 1 tablet by mouth daily.  Suspected sleep apnea -     Ambulatory referral to Neurology    Patient Instructions       If you have lab work done today you will be contacted with your lab results within the next 2 weeks.  If you have not heard from [place] then please contact us. The fastest way to get your results is to register for My Chart.   IF you received an x-ray today, you will receive an invoice from [facility]. Please contact [facility] at [phone] with questions or concerns regarding your invoice.   IF you received labwork today, you will receive an invoice from [facility]. Please contact [facility] at [phone] with questions or concerns regarding your invoice.   Our billing staff will not be able to assist you with questions regarding bills from these companies.  You will be contacted with the lab results as soon as they are available. The fastest way to get your results is to activate your My Chart account. Instructions are located on the last page of this paperwork. If you have not heard from [place] regarding the results in 2 weeks, please contact this office.     Hypertension, Adult High blood pressure (hypertension) is when the force of blood pumping through the arteries is too strong. The arteries are the  blood vessels that carry blood from the heart throughout the body. Hypertension forces the heart to work harder to pump blood and may cause arteries to become narrow or stiff. Untreated or uncontrolled hypertension can cause a heart attack, heart failure, a stroke, kidney disease, and other problems. A blood pressure reading consists of a higher number over a lower number. Ideally, your blood pressure should be below 120/80. The first ("top") number is called the systolic pressure. It is a measure of the  pressure in your arteries as your heart beats. The second ("bottom") number is called the diastolic pressure. It is a measure of the pressure in your arteries as the heart relaxes. What are the causes? The exact cause of this condition is not known. There are some conditions that result in or are related to high blood pressure. What increases the risk? Some risk factors for high blood pressure are under your control. The following factors may make you more likely to develop this condition:  Smoking.  Having type 2 diabetes mellitus, high cholesterol, or both.  Not getting enough exercise or physical activity.  Being overweight.  Having too much fat, sugar, calories, or salt (sodium) in your diet.  Drinking too much alcohol. Some risk factors for high blood pressure may be difficult or impossible to change. Some of these factors include:  Having chronic kidney disease.  Having a family history of high blood pressure.  Age. Risk increases with age.  Race. You may be at higher risk if you are African American.  Gender. Men are at higher risk than women before age 13. After age 26, women are at higher risk than men.  Having obstructive sleep apnea.  Stress. What are the signs or symptoms? High blood pressure may not cause symptoms. Very high blood pressure (hypertensive crisis) may cause:  Headache.  Anxiety.  Shortness of breath.  Nosebleed.  Nausea and vomiting.  Vision  changes.  Severe chest pain.  Seizures. How is this diagnosed? This condition is diagnosed by measuring your blood pressure while you are seated, with your arm resting on a flat surface, your legs uncrossed, and your feet flat on the floor. The cuff of the blood pressure monitor will be placed directly against the skin of your upper arm at the level of your heart. It should be measured at least twice using the same arm. Certain conditions can cause a difference in blood pressure between your right and left arms. Certain factors can cause blood pressure readings to be lower or higher than normal for a short period of time:  When your blood pressure is higher when you are in a health care provider's office than when you are at home, this is called white coat hypertension. Most people with this condition do not need medicines.  When your blood pressure is higher at home than when you are in a health care provider's office, this is called masked hypertension. Most people with this condition may need medicines to control blood pressure. If you have a high blood pressure reading during one visit or you have normal blood pressure with other risk factors, you may be asked to:  Return on a different day to have your blood pressure checked again.  Monitor your blood pressure at home for 1 week or longer. If you are diagnosed with hypertension, you may have other blood or imaging tests to help your health care provider understand your overall risk for other conditions. How is this treated? This condition is treated by making healthy lifestyle changes, such as eating healthy foods, exercising more, and reducing your alcohol intake. Your health care provider may prescribe medicine if lifestyle changes are not enough to get your blood pressure under control, and if:  Your systolic blood pressure is above 130.  Your diastolic blood pressure is above 80. Your personal target blood pressure may vary depending  on your medical conditions, your age, and other factors. Follow these instructions at home: Eating and drinking   Eat  a diet that is high in fiber and potassium, and low in sodium, added sugar, and fat. An example eating plan is called the DASH (Dietary Approaches to Stop Hypertension) diet. To eat this way: ? Eat plenty of fresh fruits and vegetables. Try to fill one half of your plate at each meal with fruits and vegetables. ? Eat whole grains, such as whole-wheat pasta, brown rice, or whole-grain bread. Fill about one fourth of your plate with whole grains. ? Eat or drink low-fat dairy products, such as skim milk or low-fat yogurt. ? Avoid fatty cuts of meat, processed or cured meats, and poultry with skin. Fill about one fourth of your plate with lean proteins, such as fish, chicken without skin, beans, eggs, or tofu. ? Avoid pre-made and processed foods. These tend to be higher in sodium, added sugar, and fat.  Reduce your daily sodium intake. Most people with hypertension should eat less than 1,500 mg of sodium a day.  Do not drink alcohol if: ? Your health care provider tells you not to drink. ? You are pregnant, may be pregnant, or are planning to become pregnant.  If you drink alcohol: ? Limit how much you use to:  0-1 drink a day for women.  0-2 drinks a day for men. ? Be aware of how much alcohol is in your drink. In the [place] one drink equals one 12 oz bottle of beer (355 mL), one 5 oz glass of wine (148 mL), or one 1 oz glass of hard liquor (44 mL). Lifestyle   Work with your health care provider to maintain a healthy body weight or to lose weight. Ask what an ideal weight is for you.  Get at least 30 minutes of exercise most days of the week. Activities may include walking, swimming, or biking.  Include exercise to strengthen your muscles (resistance exercise), such as Pilates or lifting weights, as part of your weekly exercise routine. Try to do these types of  exercises for 30 minutes at least 3 days a week.  Do not use any products that contain nicotine or tobacco, such as cigarettes, e-cigarettes, and chewing tobacco. If you need help quitting, ask your health care provider.  Monitor your blood pressure at home as told by your health care provider.  Keep all follow-up visits as told by your health care provider. This is important. Medicines  Take over-the-counter and prescription medicines only as told by your health care provider. Follow directions carefully. Blood pressure medicines must be taken as prescribed.  Do not skip doses of blood pressure medicine. Doing this puts you at risk for problems and can make the medicine less effective.  Ask your health care provider about side effects or reactions to medicines that you should watch for. Contact a health care provider if you:  Think you are having a reaction to a medicine you are taking.  Have headaches that keep coming back (recurring).  Feel dizzy.  Have swelling in your ankles.  Have trouble with your vision. Get help right away if you:  Develop a severe headache or confusion.  Have unusual weakness or numbness.  Feel faint.  Have severe pain in your chest or abdomen.  Vomit repeatedly.  Have trouble breathing. Summary  Hypertension is when the force of blood pumping through your arteries is too strong. If this condition is not controlled, it may put you at risk for serious complications.  Your personal target blood pressure may vary depending on your medical conditions,  your age, and other factors. For most people, a normal blood pressure is less than 120/80.  Hypertension is treated with lifestyle changes, medicines, or a combination of both. Lifestyle changes include losing weight, eating a healthy, low-sodium diet, exercising more, and limiting alcohol. This information is not intended to replace advice given to you by your health care provider. Make sure you  discuss any questions you have with your health care provider. Document Released: 12/17/2005 Document Revised: 08/27/2018 Document Reviewed: 08/27/2018 Elsevier Patient Education  2020 Elsevier Inc.      Agustina Caroli, MD [facility]

## 2019-07-29 NOTE — Patient Instructions (Addendum)
   If you have lab work done today you will be contacted with your lab results within the next 2 weeks.  If you have not heard from us then please contact us. The fastest way to get your results is to register for My Chart.   IF you received an x-ray today, you will receive an invoice from [facility]. Please contact [facility] at [phone] with questions or concerns regarding your invoice.   IF you received labwork today, you will receive an invoice from [facility]. Please contact [facility] at [phone] with questions or concerns regarding your invoice.   Our billing staff will not be able to assist you with questions regarding bills from these companies.  You will be contacted with the lab results as soon as they are available. The fastest way to get your results is to activate your My Chart account. Instructions are located on the last page of this paperwork. If you have not heard from us regarding the results in 2 weeks, please contact this office.     Hypertension, Adult High blood pressure (hypertension) is when the force of blood pumping through the arteries is too strong. The arteries are the blood vessels that carry blood from the heart throughout the body. Hypertension forces the heart to work harder to pump blood and may cause arteries to become narrow or stiff. Untreated or uncontrolled hypertension can cause a heart attack, heart failure, a stroke, kidney disease, and other problems. A blood pressure reading consists of a higher number over a lower number. Ideally, your blood pressure should be below 120/80. The first ("top") number is called the systolic pressure. It is a measure of the pressure in your arteries as your heart beats. The second ("bottom") number is called the diastolic pressure. It is a measure of the pressure in your arteries as the heart relaxes. What are the causes? The exact cause of this condition is not known. There are some conditions  that result in or are related to high blood pressure. What increases the risk? Some risk factors for high blood pressure are under your control. The following factors may make you more likely to develop this condition:  Smoking.  Having type 2 diabetes mellitus, high cholesterol, or both.  Not getting enough exercise or physical activity.  Being overweight.  Having too much fat, sugar, calories, or salt (sodium) in your diet.  Drinking too much alcohol. Some risk factors for high blood pressure may be difficult or impossible to change. Some of these factors include:  Having chronic kidney disease.  Having a family history of high blood pressure.  Age. Risk increases with age.  Race. You may be at higher risk if you are African American.  Gender. Men are at higher risk than women before age 45. After age 65, women are at higher risk than men.  Having obstructive sleep apnea.  Stress. What are the signs or symptoms? High blood pressure may not cause symptoms. Very high blood pressure (hypertensive crisis) may cause:  Headache.  Anxiety.  Shortness of breath.  Nosebleed.  Nausea and vomiting.  Vision changes.  Severe chest pain.  Seizures. How is this diagnosed? This condition is diagnosed by measuring your blood pressure while you are seated, with your arm resting on a flat surface, your legs uncrossed, and your feet flat on the floor. The cuff of the blood pressure monitor will be placed directly against the skin of your upper arm at the level of your heart.   It should be measured at least twice using the same arm. Certain conditions can cause a difference in blood pressure between your right and left arms. Certain factors can cause blood pressure readings to be lower or higher than normal for a short period of time:  When your blood pressure is higher when you are in a health care provider's office than when you are at home, this is called white coat hypertension.  Most people with this condition do not need medicines.  When your blood pressure is higher at home than when you are in a health care provider's office, this is called masked hypertension. Most people with this condition may need medicines to control blood pressure. If you have a high blood pressure reading during one visit or you have normal blood pressure with other risk factors, you may be asked to:  Return on a different day to have your blood pressure checked again.  Monitor your blood pressure at home for 1 week or longer. If you are diagnosed with hypertension, you may have other blood or imaging tests to help your health care provider understand your overall risk for other conditions. How is this treated? This condition is treated by making healthy lifestyle changes, such as eating healthy foods, exercising more, and reducing your alcohol intake. Your health care provider may prescribe medicine if lifestyle changes are not enough to get your blood pressure under control, and if:  Your systolic blood pressure is above 130.  Your diastolic blood pressure is above 80. Your personal target blood pressure may vary depending on your medical conditions, your age, and other factors. Follow these instructions at home: Eating and drinking   Eat a diet that is high in fiber and potassium, and low in sodium, added sugar, and fat. An example eating plan is called the DASH (Dietary Approaches to Stop Hypertension) diet. To eat this way: ? Eat plenty of fresh fruits and vegetables. Try to fill one half of your plate at each meal with fruits and vegetables. ? Eat whole grains, such as whole-wheat pasta, brown rice, or whole-grain bread. Fill about one fourth of your plate with whole grains. ? Eat or drink low-fat dairy products, such as skim milk or low-fat yogurt. ? Avoid fatty cuts of meat, processed or cured meats, and poultry with skin. Fill about one fourth of your plate with lean proteins, such  as fish, chicken without skin, beans, eggs, or tofu. ? Avoid pre-made and processed foods. These tend to be higher in sodium, added sugar, and fat.  Reduce your daily sodium intake. Most people with hypertension should eat less than 1,500 mg of sodium a day.  Do not drink alcohol if: ? Your health care provider tells you not to drink. ? You are pregnant, may be pregnant, or are planning to become pregnant.  If you drink alcohol: ? Limit how much you use to:  0-1 drink a day for women.  0-2 drinks a day for men. ? Be aware of how much alcohol is in your drink. In the [place] one drink equals one 12 oz bottle of beer (355 mL), one 5 oz glass of wine (148 mL), or one 1 oz glass of hard liquor (44 mL). Lifestyle   Work with your health care provider to maintain a healthy body weight or to lose weight. Ask what an ideal weight is for you.  Get at least 30 minutes of exercise most days of the week. Activities may include walking, swimming, or   biking.  Include exercise to strengthen your muscles (resistance exercise), such as Pilates or lifting weights, as part of your weekly exercise routine. Try to do these types of exercises for 30 minutes at least 3 days a week.  Do not use any products that contain nicotine or tobacco, such as cigarettes, e-cigarettes, and chewing tobacco. If you need help quitting, ask your health care provider.  Monitor your blood pressure at home as told by your health care provider.  Keep all follow-up visits as told by your health care provider. This is important. Medicines  Take over-the-counter and prescription medicines only as told by your health care provider. Follow directions carefully. Blood pressure medicines must be taken as prescribed.  Do not skip doses of blood pressure medicine. Doing this puts you at risk for problems and can make the medicine less effective.  Ask your health care provider about side effects or reactions to medicines that you  should watch for. Contact a health care provider if you:  Think you are having a reaction to a medicine you are taking.  Have headaches that keep coming back (recurring).  Feel dizzy.  Have swelling in your ankles.  Have trouble with your vision. Get help right away if you:  Develop a severe headache or confusion.  Have unusual weakness or numbness.  Feel faint.  Have severe pain in your chest or abdomen.  Vomit repeatedly.  Have trouble breathing. Summary  Hypertension is when the force of blood pumping through your arteries is too strong. If this condition is not controlled, it may put you at risk for serious complications.  Your personal target blood pressure may vary depending on your medical conditions, your age, and other factors. For most people, a normal blood pressure is less than 120/80.  Hypertension is treated with lifestyle changes, medicines, or a combination of both. Lifestyle changes include losing weight, eating a healthy, low-sodium diet, exercising more, and limiting alcohol. This information is not intended to replace advice given to you by your health care provider. Make sure you discuss any questions you have with your health care provider. Document Released: 12/17/2005 Document Revised: 08/27/2018 Document Reviewed: 08/27/2018 Elsevier Patient Education  2020 [employer]vier Inc.  

## 2019-10-13 ENCOUNTER — Encounter: Payer: Self-pay | Admitting: Adult Health

## 2019-10-29 ENCOUNTER — Encounter: Payer: Self-pay | Admitting: Medical

## 2019-11-02 ENCOUNTER — Ambulatory Visit: Payer: Self-pay | Admitting: Professional

## 2019-11-02 ENCOUNTER — Ambulatory Visit: Payer: 59 | Admitting: Professional

## 2020-01-27 ENCOUNTER — Ambulatory Visit: Payer: Managed Care, Other (non HMO) | Admitting: Emergency Medicine

## 2020-02-16 ENCOUNTER — Ambulatory Visit: Payer: Self-pay | Admitting: Emergency Medicine

## 2020-03-17 ENCOUNTER — Encounter: Payer: Self-pay | Admitting: Emergency Medicine

## 2020-03-17 ENCOUNTER — Telehealth (INDEPENDENT_AMBULATORY_CARE_PROVIDER_SITE_OTHER): Payer: BC Managed Care – PPO | Admitting: Emergency Medicine

## 2020-03-17 ENCOUNTER — Other Ambulatory Visit: Payer: Self-pay

## 2020-03-17 VITALS — Ht 73.0 in | Wt 195.0 lb

## 2020-03-17 DIAGNOSIS — I10 Essential (primary) hypertension: Secondary | ICD-10-CM

## 2020-03-17 MED ORDER — LISINOPRIL-HYDROCHLOROTHIAZIDE 20-12.5 MG PO TABS: 1.0000 | ORAL_TABLET | Freq: Every day | ORAL | 3 refills | Status: DC

## 2020-03-17 NOTE — Patient Instructions (Signed)
° ° ° °  If you have lab work done today you will be contacted with your lab results within the next 2 weeks.  If you have not heard from us then please contact us. The fastest way to get your results is to register for My Chart. ° ° °IF you received an x-ray today, you will receive an invoice from [facility]. Please contact [facility] at [phone] with questions or concerns regarding your invoice.  ° °IF you received labwork today, you will receive an invoice from [facility]. Please contact [facility] at [phone] with questions or concerns regarding your invoice.  ° °Our billing staff will not be able to assist you with questions regarding bills from these companies. ° °You will be contacted with the lab results as soon as they are available. The fastest way to get your results is to activate your My Chart account. Instructions are located on the last page of this paperwork. If you have not heard from us regarding the results in 2 weeks, please contact this office. °  ° ° ° °

## 2020-03-17 NOTE — Progress Notes (Signed)
Telemedicine Encounter- SOAP NOTE Established Patient MyChart video conference This video telephone encounter was conducted with the patient's (or proxy's) verbal consent via video audio telecommunications: yes/no: Yes Patient was instructed to have this encounter in a suitably private space; and to only have persons present to whom they give permission to participate. In addition, patient identity was confirmed by use of name plus two identifiers (DOB and address).  I discussed the limitations, risks, security and privacy concerns of performing an evaluation and management service by telephone and the availability of in person appointments. I also discussed with the patient that there may be a patient responsible charge related to this service. The patient expressed understanding and agreed to proceed.  I spent a total of TIME; 0 MIN TO 60 MIN: 15 minutes talking with the patient or their proxy.  Chief Complaint  Patient presents with  . Hypertension    pt states his BP has seemed his. pt seems to have no physical symptoms of hypertension .    Subjective   Luis Ward is a 50 y.o. male established patient. Telephone visit today concerned about his hypertension.  Latest readings have been high.  Today's blood pressure 157/115.  Asymptomatic. Presently on Zestoretic 10-12.5 mg but has missed several doses.  HPI   Patient Active Problem List   Diagnosis Date Noted  . Essential hypertension 01/15/2019  . Chronic left flank pain 10/02/2018    No past medical history on file.  Current Outpatient Medications  Medication Sig Dispense Refill  . lisinopril-hydrochlorothiazide (ZESTORETIC) 10-12.5 MG tablet Take 1 tablet by mouth daily. 90 tablet 3   No current facility-administered medications for this visit.    No Known Allergies  Social History   Socioeconomic History  . Marital status: Married    Spouse name: Not on file  . Number of children: Not on file  . Years of  education: Not on file  . Highest education level: Not on file  Occupational History  . Not on file  Tobacco Use  . Smoking status: Never Smoker  . Smokeless tobacco: Never Used  Substance and Sexual Activity  . Alcohol use: Yes    Alcohol/week: 0.0 standard drinks  . Drug use: No  . Sexual activity: Yes  Other Topics Concern  . Not on file  Social History Narrative  . Not on file   Social Determinants of Health   Financial Resource Strain:   . Difficulty of Paying Living Expenses:   Food Insecurity:   . Worried About Charity fundraiser in the Last Year:   . Arboriculturist in the Last Year:   Transportation Needs:   . Film/video editor (Medical):   Marland Kitchen Lack of Transportation (Non-Medical):   Physical Activity:   . Days of Exercise per Week:   . Minutes of Exercise per Session:   Stress:   . Feeling of Stress :   Social Connections:   . Frequency of Communication with Friends and Family:   . Frequency of Social Gatherings with Friends and Family:   . Attends Religious Services:   . Active Member of Clubs or Organizations:   . Attends Archivist Meetings:   Marland Kitchen Marital Status:   Intimate Partner Violence:   . Fear of Current or Ex-Partner:   . Emotionally Abused:   Marland Kitchen Physically Abused:   . Sexually Abused:     Review of Systems  Constitutional: Negative.  Negative for chills and fever.  HENT: Negative.  Negative for congestion and sore throat.   Respiratory: Negative.  Negative for cough and shortness of breath.   Cardiovascular: Negative.  Negative for chest pain and palpitations.  Gastrointestinal: Negative for abdominal pain, nausea and vomiting.  Genitourinary: Negative.  Negative for dysuria and hematuria.  Skin: Negative.  Negative for rash.  Neurological: Negative.  Negative for dizziness, sensory change, speech change, loss of consciousness and headaches.  All other systems reviewed and are negative.   Objective  Alert and oriented x3 in  no apparent respiratory distress. Vitals as reported by the patient: Today's Vitals   03/17/20 1334  Weight: 195 lb (88.5 kg)  Height: 6\' 1"  (1.854 m)    There are no diagnoses linked to this encounter. [name] was seen today for hypertension.  Diagnoses and all orders for this visit:  Uncontrolled hypertension -     lisinopril-hydrochlorothiazide (ZESTORETIC) 20-12.5 MG tablet; Take 1 tablet by mouth daily.  Clinically stable.  No red flag signs or symptoms.  Will increase dose of Zestoretic to 20-12.5 mg daily.  Advised to continue monitoring his blood pressure readings at home and contact the office if persistently high readings.   I discussed the assessment and treatment plan with the patient. The patient was provided an opportunity to ask questions and all were answered. The patient agreed with the plan and demonstrated an understanding of the instructions.   The patient was advised to call back or seek an in-person evaluation if the symptoms worsen or if the condition fails to improve as anticipated.  I provided 15 minutes of non-face-to-face time during this encounter.  Horald Pollen, MD  Primary Care at [facility]

## 2020-03-24 ENCOUNTER — Other Ambulatory Visit: Payer: Self-pay | Admitting: Emergency Medicine

## 2020-04-25 ENCOUNTER — Telehealth: Payer: Self-pay | Admitting: Emergency Medicine

## 2020-04-25 ENCOUNTER — Encounter: Payer: Self-pay | Admitting: Emergency Medicine

## 2020-04-25 ENCOUNTER — Other Ambulatory Visit: Payer: Self-pay

## 2020-04-25 ENCOUNTER — Telehealth (INDEPENDENT_AMBULATORY_CARE_PROVIDER_SITE_OTHER): Payer: BC Managed Care – PPO | Admitting: Emergency Medicine

## 2020-04-25 VITALS — BP 140/100

## 2020-04-25 DIAGNOSIS — I10 Essential (primary) hypertension: Secondary | ICD-10-CM

## 2020-04-25 NOTE — Telephone Encounter (Signed)
Called but no answer. Not answering my chart video conference invitation either.

## 2020-04-25 NOTE — Patient Instructions (Signed)
° ° ° °  If you have lab work done today you will be contacted with your lab results within the next 2 weeks.  If you have not heard from us then please contact us. The fastest way to get your results is to register for My Chart. ° ° °IF you received an x-ray today, you will receive an invoice from [facility]. Please contact [facility] at [phone] with questions or concerns regarding your invoice.  ° °IF you received labwork today, you will receive an invoice from [facility]. Please contact [facility] at [phone] with questions or concerns regarding your invoice.  ° °Our billing staff will not be able to assist you with questions regarding bills from these companies. ° °You will be contacted with the lab results as soon as they are available. The fastest way to get your results is to activate your My Chart account. Instructions are located on the last page of this paperwork. If you have not heard from us regarding the results in 2 weeks, please contact this office. °  ° ° ° °

## 2020-04-26 NOTE — Progress Notes (Signed)
No answer. MyChart video conference invitation not accepted.

## 2020-04-27 ENCOUNTER — Encounter: Payer: Self-pay | Admitting: Emergency Medicine

## 2020-04-27 ENCOUNTER — Other Ambulatory Visit: Payer: Self-pay

## 2020-04-27 ENCOUNTER — Telehealth (INDEPENDENT_AMBULATORY_CARE_PROVIDER_SITE_OTHER): Payer: BC Managed Care – PPO | Admitting: Emergency Medicine

## 2020-04-27 VITALS — Ht 73.0 in | Wt 195.0 lb

## 2020-04-27 DIAGNOSIS — I10 Essential (primary) hypertension: Secondary | ICD-10-CM | POA: Diagnosis not present

## 2020-04-27 DIAGNOSIS — S76212A Strain of adductor muscle, fascia and tendon of left thigh, initial encounter: Secondary | ICD-10-CM

## 2020-04-27 DIAGNOSIS — R1032 Left lower quadrant pain: Secondary | ICD-10-CM | POA: Diagnosis not present

## 2020-04-27 NOTE — Progress Notes (Signed)
Telemedicine Encounter- SOAP NOTE Established Patient MyChart video conference This video telephone encounter was conducted with the patient's (or proxy's) verbal consent via video audio telecommunications: yes/no: Yes Patient was instructed to have this encounter in a suitably private space; and to only have persons present to whom they give permission to participate. In addition, patient identity was confirmed by use of name plus two identifiers (DOB and address).  I discussed the limitations, risks, security and privacy concerns of performing an evaluation and management service by telephone and the availability of in person appointments. I also discussed with the patient that there may be a patient responsible charge related to this service. The patient expressed understanding and agreed to proceed.  I spent a total of TIME; 0 MIN TO 60 MIN: 15 minutes talking with the patient or their proxy.  Chief Complaint  Patient presents with  . pulled musle    left hip area     Subjective   Luis Ward is a 50 y.o. male established patient. Telephone visit today concerned about high blood pressure.  Has history of hypertension and presently taking lisinopril-hydrochlorothiazide 20-12.5 mg.  Blood pressure readings were high at home 160/110.  Doubled up on his medication for several days with good results.  Needs to know if he can continue to do this. Also complaining of pulled left groin muscle that started several days ago and is slowly now getting better.  Denies any urinary symptoms.  Denies any known injury.  No other associated symptoms. Had episode of epididymitis when he was younger but denies testicular pain.  HPI   Patient Active Problem List   Diagnosis Date Noted  . Essential hypertension 01/15/2019  . Chronic left flank pain 10/02/2018    No past medical history on file.  Current Outpatient Medications  Medication Sig Dispense Refill  . lisinopril-hydrochlorothiazide  (ZESTORETIC) 20-12.5 MG tablet Take 1 tablet by mouth daily. 90 tablet 3   No current facility-administered medications for this visit.    No Known Allergies  Social History   Socioeconomic History  . Marital status: Married    Spouse name: Not on file  . Number of children: Not on file  . Years of education: Not on file  . Highest education level: Not on file  Occupational History  . Not on file  Tobacco Use  . Smoking status: Never Smoker  . Smokeless tobacco: Never Used  Substance and Sexual Activity  . Alcohol use: Yes    Alcohol/week: 0.0 standard drinks  . Drug use: No  . Sexual activity: Yes  Other Topics Concern  . Not on file  Social History Narrative  . Not on file   Social Determinants of Health   Financial Resource Strain:   . Difficulty of Paying Living Expenses:   Food Insecurity:   . Worried About Charity fundraiser in the Last Year:   . Arboriculturist in the Last Year:   Transportation Needs:   . Film/video editor (Medical):   Marland Kitchen Lack of Transportation (Non-Medical):   Physical Activity:   . Days of Exercise per Week:   . Minutes of Exercise per Session:   Stress:   . Feeling of Stress :   Social Connections:   . Frequency of Communication with Friends and Family:   . Frequency of Social Gatherings with Friends and Family:   . Attends Religious Services:   . Active Member of Clubs or Organizations:   . Attends  Club or Organization Meetings:   Marland Kitchen Marital Status:   Intimate Partner Violence:   . Fear of Current or Ex-Partner:   . Emotionally Abused:   Marland Kitchen Physically Abused:   . Sexually Abused:     Review of Systems  Constitutional: Negative.  Negative for chills and fever.  HENT: Negative.  Negative for congestion and sore throat.   Respiratory: Negative.  Negative for cough and shortness of breath.   Cardiovascular: Negative.  Negative for chest pain and palpitations.  Gastrointestinal: Negative.  Negative for abdominal pain, blood in  stool, diarrhea, melena, nausea and vomiting.  Genitourinary: Negative.  Negative for dysuria, flank pain, frequency, hematuria and urgency.  Skin: Negative.  Negative for rash.  Neurological: Negative.  Negative for dizziness and headaches.  All other systems reviewed and are negative.   Objective  Alert and oriented x3 in no apparent respiratory distress. Vitals as reported by the patient: Today's Vitals   04/27/20 1107  Weight: 195 lb (88.5 kg)  Height: 6\' 1"  (1.854 m)    There are no diagnoses linked to this encounter. Girard was seen today for pulled musle.  Diagnoses and all orders for this visit:  Uncontrolled hypertension  Essential hypertension  Strain of left groin  Left groin pain   Advised to increase dose of lisinopril-hydrochlorothiazide to 40-25 mg daily. Groin pain seems to be musculoskeletal in nature.  Advised to rest and take Tylenol as needed. Follow-up in the office as needed or as already scheduled.  I discussed the assessment and treatment plan with the patient. The patient was provided an opportunity to ask questions and all were answered. The patient agreed with the plan and demonstrated an understanding of the instructions.   The patient was advised to call back or seek an in-person evaluation if the symptoms worsen or if the condition fails to improve as anticipated.  I provided 15 minutes of non-face-to-face time during this encounter.  Horald Pollen, MD  Primary Care at [facility]

## 2020-05-02 ENCOUNTER — Encounter: Payer: Self-pay | Admitting: Emergency Medicine

## 2020-05-13 ENCOUNTER — Telehealth: Payer: Self-pay | Admitting: Emergency Medicine

## 2020-05-13 NOTE — Telephone Encounter (Signed)
Pt is wondering if he should see a specialist for his pulled groin muscle. He says it is very painful. He wants your opinion. Please advise at [phone].

## 2020-05-17 NOTE — Telephone Encounter (Signed)
It is up to him.  This type of injuries take time to heal.  Thanks.

## 2020-05-17 NOTE — Telephone Encounter (Signed)
Please Advise

## 2020-05-19 ENCOUNTER — Other Ambulatory Visit: Payer: Self-pay | Admitting: Emergency Medicine

## 2020-05-19 ENCOUNTER — Other Ambulatory Visit: Payer: Self-pay

## 2020-05-19 DIAGNOSIS — I10 Essential (primary) hypertension: Secondary | ICD-10-CM

## 2020-05-19 MED ORDER — LISINOPRIL-HYDROCHLOROTHIAZIDE 20-12.5 MG PO TABS: 1.0000 | ORAL_TABLET | Freq: Every day | ORAL | 3 refills | Status: DC

## 2020-05-19 NOTE — Telephone Encounter (Signed)
Relation to pt: self Call back number: [phone]  Pharmacy: [id number] - [place], [address] AT [address] Phone:  [phone]  Fax:  [phone]       Reason for call:  Patient requesting lisinopril-hydrochlorothiazide (ZESTORETIC) stating PCP increase the dosage to  40-25. Informed patient please allow 48 to 72 hour turn around time/ patient states he's completley out and would like a follow up call.

## 2020-05-19 NOTE — Telephone Encounter (Signed)
It does not come in a 40--25 mg tablet.  He has been taking 20-12.5 mg tablet.  So he should take 2 tablets daily.  Thanks

## 2020-05-19 NOTE — Telephone Encounter (Signed)
Per your note on 04/27/20, Advised to increase dose of lisinopril-hydrochlorothiazide to 40-25 mg daily. I do not see that dose to order it. Can you pend the correct med for me to send

## 2020-05-19 NOTE — Telephone Encounter (Signed)
Can not find increase of dosage to 40-25 of requested medication- Lisinopril- hydrochlorothiazide- request sent for review

## 2020-05-23 ENCOUNTER — Other Ambulatory Visit: Payer: Self-pay

## 2020-05-23 DIAGNOSIS — I10 Essential (primary) hypertension: Secondary | ICD-10-CM

## 2020-05-23 MED ORDER — LISINOPRIL-HYDROCHLOROTHIAZIDE 20-12.5 MG PO TABS: 2.0000 | ORAL_TABLET | Freq: Every day | ORAL | 1 refills | Status: DC

## 2020-05-23 NOTE — Telephone Encounter (Signed)
It does not come in a 40-25 mg tablet.  Lisinopril comes in a 40 mg tablet and hydrochlorothiazide in a 25 mg tablet.  1 choice is to take 1 of each and the second choice is to take 2 of the 20-12.5 mg tablets daily.  His choice.  Thanks.

## 2020-05-23 NOTE — Telephone Encounter (Signed)
Patient calling back to speak to dr Mitchel Honour or his nurse about his medication. Per patient the medication was increased by the Dr but there was not a new Rx sent to the pharmacy. He would like to have this resolved asap because its quite expensive when he runs out of medication and he has to pay out of pocket. Please advise Ph# [phone]

## 2020-05-23 NOTE — Telephone Encounter (Signed)
Called pt to inform him medication adjustment has been sent to the pharmacy

## 2020-05-23 NOTE — Telephone Encounter (Signed)
Patient is calling requesting a new prescription reflecting dosage indicated at previous visit to take 2 tabs daily of his BP prescription in order for insurance to cover it. Medication is Lisinopril/ hctz 20-12.5 mg 2 tabs daily. He is out of it and if it can be written as a 40 mg -25 mg  mg one daily he would prefer to take one daily. Please advice

## 2020-11-20 ENCOUNTER — Other Ambulatory Visit: Payer: Self-pay | Admitting: Emergency Medicine

## 2020-11-20 DIAGNOSIS — I10 Essential (primary) hypertension: Secondary | ICD-10-CM

## 2020-11-20 NOTE — Telephone Encounter (Signed)
Courtesy refill  

## 2020-12-14 ENCOUNTER — Other Ambulatory Visit: Payer: Self-pay | Admitting: Emergency Medicine

## 2020-12-14 DIAGNOSIS — I10 Essential (primary) hypertension: Secondary | ICD-10-CM

## 2020-12-29 ENCOUNTER — Other Ambulatory Visit: Payer: Self-pay | Admitting: Emergency Medicine

## 2020-12-29 DIAGNOSIS — I10 Essential (primary) hypertension: Secondary | ICD-10-CM

## 2020-12-29 NOTE — Telephone Encounter (Signed)
Attempted to contact pt.  Left vm to call and schedule f/u appt.  Courtesy refill #60 given; needs office visit for further refills.  Requested Prescriptions  Pending Prescriptions Disp Refills   lisinopril-hydrochlorothiazide (ZESTORETIC) 20-12.5 MG tablet [Pharmacy Med Name: LISINOPRIL-HCTZ 20-12.5 MG TAB] 60 tablet 0    Sig: TAKE 2 TABLETS BY MOUTH EVERY DAY     Cardiovascular:  ACEI + Diuretic Combos Failed - 12/29/2020 12:57 PM      Failed - Na in normal range and within 180 days    Sodium  Date Value Ref Range Status  01/15/2019 138 134 - 144 mmol/L Final         Failed - K in normal range and within 180 days    Potassium  Date Value Ref Range Status  01/15/2019 4.6 3.5 - 5.2 mmol/L Final         Failed - Cr in normal range and within 180 days    Creatinine, Ser  Date Value Ref Range Status  01/15/2019 0.84 0.76 - 1.27 mg/dL Final         Failed - Ca in normal range and within 180 days    Calcium  Date Value Ref Range Status  01/15/2019 9.6 8.7 - 10.2 mg/dL Final         Failed - Last BP in normal range    BP Readings from Last 1 Encounters:  04/25/20 (!) 140/100         Failed - Valid encounter within last 6 months    Recent Outpatient Visits          8 months ago Uncontrolled hypertension   [facility] at [place], Eilleen Kempf, MD   8 months ago Uncontrolled hypertension   [facility] at [place], Eilleen Kempf, MD   9 months ago Uncontrolled hypertension   [facility] at [place], Eilleen Kempf, MD   1 year ago Essential hypertension   [facility] at [place], Eilleen Kempf, MD   1 year ago Essential hypertension   [facility] at [place], [place], MD             Passed - Patient is not pregnant

## 2021-01-22 ENCOUNTER — Other Ambulatory Visit: Payer: Self-pay | Admitting: Emergency Medicine

## 2021-01-22 DIAGNOSIS — I10 Essential (primary) hypertension: Secondary | ICD-10-CM

## 2021-01-22 NOTE — Telephone Encounter (Signed)
Requested medication (s) are due for refill today:   Yes  Requested medication (s) are on the active medication list:   Yes  Future visit scheduled:   No   Last ordered: 12/19/2020 #60, 0 refills  Clinic Note:  Pt was notified to call office for an appt.   No appt noted.   Courtesy refill was given last month.  Returned for further disposition on refill.   Requested Prescriptions  Pending Prescriptions Disp Refills   lisinopril-hydrochlorothiazide (ZESTORETIC) 20-12.5 MG tablet [Pharmacy Med Name: LISINOPRIL-HCTZ 20-12.5 MG TAB] 60 tablet 0    Sig: TAKE 2 TABLETS BY MOUTH EVERY DAY      Cardiovascular:  ACEI + Diuretic Combos Failed - 01/22/2021  9:17 AM      Failed - Na in normal range and within 180 days    Sodium  Date Value Ref Range Status  01/15/2019 138 134 - 144 mmol/L Final          Failed - K in normal range and within 180 days    Potassium  Date Value Ref Range Status  01/15/2019 4.6 3.5 - 5.2 mmol/L Final          Failed - Cr in normal range and within 180 days    Creatinine, Ser  Date Value Ref Range Status  01/15/2019 0.84 0.76 - 1.27 mg/dL Final          Failed - Ca in normal range and within 180 days    Calcium  Date Value Ref Range Status  01/15/2019 9.6 8.7 - 10.2 mg/dL Final          Failed - Last BP in normal range    BP Readings from Last 1 Encounters:  04/25/20 (!) 140/100          Failed - Valid encounter within last 6 months    Recent Outpatient Visits           9 months ago Uncontrolled hypertension   [facility] at [facility], Ines Bloomer, MD   9 months ago Uncontrolled hypertension   [facility] at [place], Ines Bloomer, MD   10 months ago Uncontrolled hypertension   [facility] at [place], Ines Bloomer, MD   1 year ago Essential hypertension   [facility] at [place], Ines Bloomer, MD   2 years ago Essential hypertension   [facility] at [place], [place], [place] - Patient is not pregnant

## 2021-01-23 NOTE — Telephone Encounter (Signed)
Pt has an appt set up for 1/27

## 2021-01-26 ENCOUNTER — Encounter: Payer: Self-pay | Admitting: Emergency Medicine

## 2021-01-26 ENCOUNTER — Other Ambulatory Visit: Payer: Self-pay

## 2021-01-26 ENCOUNTER — Ambulatory Visit: Payer: BC Managed Care – PPO | Admitting: Emergency Medicine

## 2021-01-26 VITALS — BP 119/78 | HR 97 | Temp 98.6°F | Resp 16 | Ht 73.0 in | Wt 205.0 lb

## 2021-01-26 DIAGNOSIS — E785 Hyperlipidemia, unspecified: Secondary | ICD-10-CM

## 2021-01-26 DIAGNOSIS — I10 Essential (primary) hypertension: Secondary | ICD-10-CM

## 2021-01-26 MED ORDER — LISINOPRIL-HYDROCHLOROTHIAZIDE 20-12.5 MG PO TABS: 2.0000 | ORAL_TABLET | Freq: Every day | ORAL | 3 refills | Status: DC

## 2021-01-26 NOTE — Patient Instructions (Addendum)
   If you have lab work done today you will be contacted with your lab results within the next 2 weeks.  If you have not heard from us then please contact us. The fastest way to get your results is to register for My Chart.   IF you received an x-ray today, you will receive an invoice from [facility]. Please contact [facility] at [phone] with questions or concerns regarding your invoice.   IF you received labwork today, you will receive an invoice from [facility]. Please contact [facility] at [phone] with questions or concerns regarding your invoice.   Our billing staff will not be able to assist you with questions regarding bills from these companies.  You will be contacted with the lab results as soon as they are available. The fastest way to get your results is to activate your My Chart account. Instructions are located on the last page of this paperwork. If you have not heard from us regarding the results in 2 weeks, please contact this office.      Health Maintenance, Male Adopting a healthy lifestyle and getting preventive care are important in promoting health and wellness. Ask your health care provider about:  The right schedule for you to have regular tests and exams.  Things you can do on your own to prevent diseases and keep yourself healthy. What should I know about diet, weight, and exercise? Eat a healthy diet  Eat a diet that includes plenty of vegetables, fruits, low-fat dairy products, and lean protein.  Do not eat a lot of foods that are high in solid fats, added sugars, or sodium.   Maintain a healthy weight Body mass index (BMI) is a measurement that can be used to identify possible weight problems. It estimates body fat based on height and weight. Your health care provider can help determine your BMI and help you achieve or maintain a healthy weight. Get regular exercise Get regular exercise. This is one of the most important things you  can do for your health. Most adults should:  Exercise for at least 150 minutes each week. The exercise should increase your heart rate and make you sweat (moderate-intensity exercise).  Do strengthening exercises at least twice a week. This is in addition to the moderate-intensity exercise.  Spend less time sitting. Even light physical activity can be beneficial. Watch cholesterol and blood lipids Have your blood tested for lipids and cholesterol at 49 years of age, then have this test every 5 years. You may need to have your cholesterol levels checked more often if:  Your lipid or cholesterol levels are high.  You are older than 49 years of age.  You are at high risk for heart disease. What should I know about cancer screening? Many types of cancers can be detected early and may often be prevented. Depending on your health history and family history, you may need to have cancer screening at various ages. This may include screening for:  Colorectal cancer.  Prostate cancer.  Skin cancer.  Lung cancer. What should I know about heart disease, diabetes, and high blood pressure? Blood pressure and heart disease  High blood pressure causes heart disease and increases the risk of stroke. This is more likely to develop in people who have high blood pressure readings, are of African descent, or are overweight.  Talk with your health care provider about your target blood pressure readings.  Have your blood pressure checked: ? Every 3-5 years if you are   18-39 years of age. ? Every year if you are 40 years old or older.  If you are between the ages of 65 and 75 and are a current or former smoker, ask your health care provider if you should have a one-time screening for abdominal aortic aneurysm (AAA). Diabetes Have regular diabetes screenings. This checks your fasting blood sugar level. Have the screening done:  Once every three years after age 45 if you are at a normal weight and have  a low risk for diabetes.  More often and at a younger age if you are overweight or have a high risk for diabetes. What should I know about preventing infection? Hepatitis B If you have a higher risk for hepatitis B, you should be screened for this virus. Talk with your health care provider to find out if you are at risk for hepatitis B infection. Hepatitis C Blood testing is recommended for:  Everyone born from 1945 through 1965.  Anyone with known risk factors for hepatitis C. Sexually transmitted infections (STIs)  You should be screened each year for STIs, including gonorrhea and chlamydia, if: ? You are sexually active and are younger than 49 years of age. ? You are older than 49 years of age and your health care provider tells you that you are at risk for this type of infection. ? Your sexual activity has changed since you were last screened, and you are at increased risk for chlamydia or gonorrhea. Ask your health care provider if you are at risk.  Ask your health care provider about whether you are at high risk for HIV. Your health care provider may recommend a prescription medicine to help prevent HIV infection. If you choose to take medicine to prevent HIV, you should first get tested for HIV. You should then be tested every 3 months for as long as you are taking the medicine. Follow these instructions at home: Lifestyle  Do not use any products that contain nicotine or tobacco, such as cigarettes, e-cigarettes, and chewing tobacco. If you need help quitting, ask your health care provider.  Do not use street drugs.  Do not share needles.  Ask your health care provider for help if you need support or information about quitting drugs. Alcohol use  Do not drink alcohol if your health care provider tells you not to drink.  If you drink alcohol: ? Limit how much you have to 0-2 drinks a day. ? Be aware of how much alcohol is in your drink. In the [place] one drink equals one 12  oz bottle of beer (355 mL), one 5 oz glass of wine (148 mL), or one 1 oz glass of hard liquor (44 mL). General instructions  Schedule regular health, dental, and eye exams.  Stay current with your vaccines.  Tell your health care provider if: ? You often feel depressed. ? You have ever been abused or do not feel safe at home. Summary  Adopting a healthy lifestyle and getting preventive care are important in promoting health and wellness.  Follow your health care provider's instructions about healthy diet, exercising, and getting tested or screened for diseases.  Follow your health care provider's instructions on monitoring your cholesterol and blood pressure. This information is not intended to replace advice given to you by your health care provider. Make sure you discuss any questions you have with your health care provider. Document Revised: 12/10/2018 Document Reviewed: 12/10/2018 Elsevier Patient Education  2021 Elsevier Inc.  

## 2021-01-26 NOTE — Progress Notes (Signed)
Luis Ward 51 y.o.   Chief Complaint  Patient presents with  . Medication Refill    HISTORY OF PRESENT ILLNESS: This is a 51 y.o. male with history of hypertension here for follow-up and medication refill. Has no complaints or medical concerns today. The 10-year ASCVD risk score Luis Ward DC Luis Ward., et al., 2013) is: 3.2%   Values used to calculate the score:     Age: 89 years     Sex: Male     Is Non-Hispanic African American: No     Diabetic: No     Tobacco smoker: No     Systolic Blood Pressure: 123456 mmHg     Is BP treated: Yes     HDL Cholesterol: 64 mg/dL     Total Cholesterol: 237 mg/dL  HPI   Prior to Admission medications   Medication Sig Start Date End Date Taking? Authorizing Provider  lisinopril-hydrochlorothiazide (ZESTORETIC) 20-12.5 MG tablet TAKE 2 TABLETS BY MOUTH EVERY DAY 12/29/20  Yes Luis Ward, Ines Bloomer, MD    No Known Allergies  Patient Active Problem List   Diagnosis Date Noted  . Essential hypertension 01/15/2019  . 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 No pertinent past medical history.  History reviewed. No pertinent surgical history.  Social History   Socioeconomic History  . Marital status: Married    Spouse name: Not on file  . Number of children: Not on file  . Years of education: Not on file  . Highest education level: Not on file  Occupational History  . Not on file  Tobacco Use  . Smoking status: Never Smoker  . Smokeless tobacco: Never Used  Substance and Sexual Activity  . Alcohol use: Yes    Alcohol/week: 0.0 standard drinks  . Drug use: No  . Sexual activity: Yes  Other Topics Concern  . Not on file  Social History Narrative  . Not on file   Social Determinants of Health   Financial Resource Strain: Not on file  Food Insecurity: Not on file  Transportation Needs: Not on file  Physical Activity: Not on file  Stress: Not on file  Social Connections: Not on file  Intimate Partner Violence: Not  on file    Family History  Problem Relation Age of Onset  . Heart disease Father   . Hyperlipidemia Father   . Hypertension Father      Review of Systems  Constitutional: Negative.  Negative for chills and fever.  HENT: Negative.  Negative for congestion and sore throat.   Respiratory: Negative.  Negative for cough and shortness of breath.   Cardiovascular: Negative.  Negative for chest pain and palpitations.  Gastrointestinal: Negative.  Negative for abdominal pain, diarrhea, nausea and vomiting.  Genitourinary: Negative.  Negative for dysuria and hematuria.  Musculoskeletal: Negative.  Negative for joint pain and myalgias.  Skin: Negative.  Negative for rash.  Neurological: Negative.  Negative for dizziness and headaches.  All other systems reviewed and are negative.   Today's Vitals   01/26/21 1041  BP: 119/78  Pulse: 97  Resp: 16  Temp: 98.6 F (37 C)  TempSrc: Temporal  SpO2: 97%  Weight: 205 lb (93 kg)  Height: 6\' 1"  (1.854 m)   Body mass index is 27.05 kg/m. BP Readings from Last 3 Encounters:  01/26/21 119/78  04/25/20 (!) 140/100  07/29/19 122/79   Wt Readings from Last 3 Encounters:  01/26/21 205 lb (93 kg)  04/27/20 195 lb (88.5 kg)  03/17/20 195 lb (88.5 kg)    Physical Exam Vitals reviewed.  Constitutional:      Appearance: Normal appearance.  HENT:     Head: Normocephalic.  Eyes:     Extraocular Movements: Extraocular movements intact.     Conjunctiva/sclera: Conjunctivae normal.     Pupils: Pupils are equal, round, and reactive to light.  Cardiovascular:     Rate and Rhythm: Normal rate and regular rhythm.     Pulses: Normal pulses.     Heart sounds: Normal heart sounds.  Pulmonary:     Effort: Pulmonary effort is normal.     Breath sounds: Normal breath sounds.  Abdominal:     General: Bowel sounds are normal. There is no distension.     Palpations: Abdomen is soft.     Tenderness: There is no abdominal tenderness.   Musculoskeletal:     Cervical back: Normal range of motion and neck supple.  Skin:    General: Skin is warm and dry.     Capillary Refill: Capillary refill takes less than 2 seconds.  Neurological:     General: No focal deficit present.     Mental Status: He is alert and oriented to person, place, and time.  Psychiatric:        Mood and Affect: Mood normal.        Behavior: Behavior normal.    A total of 30 minutes was spent with the patient, greater than 50% of which was in counseling/coordination of care regarding hypertension and cardiovascular risks associated with this condition, review of all medications, need to have lipid profile repeated while fasting, review of most recent office visit notes, review of most recent blood work results, education on nutrition, prognosis, documentation, and need for follow-up.   ASSESSMENT & PLAN: Clinically stable.  No medical concerns identified during this visit. Continue present medication.  No changes. Blood work while fasting. Follow-up in 6 months. Luis Ward was seen today for medication refill.  Diagnoses and all orders for this visit:  Essential hypertension -     Lipid panel; Future -     Comprehensive metabolic panel; Future -     lisinopril-hydrochlorothiazide (ZESTORETIC) 20-12.5 MG tablet; Take 2 tablets by mouth daily.  Dyslipidemia    Patient Instructions       If you have lab work done today you will be contacted with your lab results within the next 2 weeks.  If you have not heard from [place] then please contact us. The fastest way to get your results is to register for My Chart.   IF you received an x-ray today, you will receive an invoice from [facility]. Please contact [facility] at [phone] with questions or concerns regarding your invoice.   IF you received labwork today, you will receive an invoice from [facility]. Please contact [facility] at [phone] with questions or concerns regarding  your invoice.   Our billing staff will not be able to assist you with questions regarding bills from these companies.  You will be contacted with the lab results as soon as they are available. The fastest way to get your results is to activate your My Chart account. Instructions are located on the last page of this paperwork. If you have not heard from [place] regarding the results in 2 weeks, please contact this office.     Health Maintenance, Male Adopting a healthy lifestyle and getting preventive care are important in promoting health and wellness. Ask your health care provider about:  The right schedule for you to have regular tests and exams.  Things you can do on your own to prevent diseases and keep yourself healthy. What should I know about diet, weight, and exercise? Eat a healthy diet  Eat a diet that includes plenty of vegetables, fruits, low-fat dairy products, and lean protein.  Do not eat a lot of foods that are high in solid fats, added sugars, or sodium.   Maintain a healthy weight Body mass index (BMI) is a measurement that can be used to identify possible weight problems. It estimates body fat based on height and weight. Your health care provider can help determine your BMI and help you achieve or maintain a healthy weight. Get regular exercise Get regular exercise. This is one of the most important things you can do for your health. Most adults should:  Exercise for at least 150 minutes each week. The exercise should increase your heart rate and make you sweat (moderate-intensity exercise).  Do strengthening exercises at least twice a week. This is in addition to the moderate-intensity exercise.  Spend less time sitting. Even light physical activity can be beneficial. Watch cholesterol and blood lipids Have your blood tested for lipids and cholesterol at 49 years of age, then have this test every 5 years. You may need to have your cholesterol levels checked more often  if:  Your lipid or cholesterol levels are high.  You are older than 49 years of age.  You are at high risk for heart disease. What should I know about cancer screening? Many types of cancers can be detected early and may often be prevented. Depending on your health history and family history, you may need to have cancer screening at various ages. This may include screening for:  Colorectal cancer.  Prostate cancer.  Skin cancer.  Lung cancer. What should I know about heart disease, diabetes, and high blood pressure? Blood pressure and heart disease  High blood pressure causes heart disease and increases the risk of stroke. This is more likely to develop in people who have high blood pressure readings, are of African descent, or are overweight.  Talk with your health care provider about your target blood pressure readings.  Have your blood pressure checked: ? Every 3-5 years if you are 16-37 years of age. ? Every year if you are 30 years old or older.  If you are between the ages of 36 and 23 and are a current or former smoker, ask your health care provider if you should have a one-time screening for abdominal aortic aneurysm (AAA). Diabetes Have regular diabetes screenings. This checks your fasting blood sugar level. Have the screening done:  Once every three years after age 75 if you are at a normal weight and have a low risk for diabetes.  More often and at a younger age if you are overweight or have a high risk for diabetes. What should I know about preventing infection? Hepatitis B If you have a higher risk for hepatitis B, you should be screened for this virus. Talk with your health care provider to find out if you are at risk for hepatitis B infection. Hepatitis C Blood testing is recommended for:  Everyone born from [birth year] through 1965.  Anyone with known risk factors for hepatitis C. Sexually transmitted infections (STIs)  You should be screened each year for STIs,  including gonorrhea and chlamydia, if: ? You are sexually active and are younger than 49 years of age. ? You are  older than 49 years of age and your health care provider tells you that you are at risk for this type of infection. ? Your sexual activity has changed since you were last screened, and you are at increased risk for chlamydia or gonorrhea. Ask your health care provider if you are at risk.  Ask your health care provider about whether you are at high risk for HIV. Your health care provider may recommend a prescription medicine to help prevent HIV infection. If you choose to take medicine to prevent HIV, you should first get tested for HIV. You should then be tested every 3 months for as long as you are taking the medicine. Follow these instructions at home: Lifestyle  Do not use any products that contain nicotine or tobacco, such as cigarettes, e-cigarettes, and chewing tobacco. If you need help quitting, ask your health care provider.  Do not use street drugs.  Do not share needles.  Ask your health care provider for help if you need support or information about quitting drugs. Alcohol use  Do not drink alcohol if your health care provider tells you not to drink.  If you drink alcohol: ? Limit how much you have to 0-2 drinks a day. ? Be aware of how much alcohol is in your drink. In the [place] one drink equals one 12 oz bottle of beer (355 mL), one 5 oz glass of wine (148 mL), or one 1 oz glass of hard liquor (44 mL). General instructions  Schedule regular health, dental, and eye exams.  Stay current with your vaccines.  Tell your health care provider if: ? You often feel depressed. ? You have ever been abused or do not feel safe at home. Summary  Adopting a healthy lifestyle and getting preventive care are important in promoting health and wellness.  Follow your health care provider's instructions about healthy diet, exercising, and getting tested or screened for  diseases.  Follow your health care provider's instructions on monitoring your cholesterol and blood pressure. This information is not intended to replace advice given to you by your health care provider. Make sure you discuss any questions you have with your health care provider. Document Revised: 12/10/2018 Document Reviewed: 12/10/2018 Elsevier Patient Education  2021 Elsevier Inc.      Agustina Caroli, MD [facility]

## 2021-05-24 ENCOUNTER — Ambulatory Visit: Payer: Self-pay | Admitting: Emergency Medicine

## 2021-06-08 ENCOUNTER — Other Ambulatory Visit: Payer: Self-pay

## 2021-06-08 ENCOUNTER — Ambulatory Visit: Payer: BC Managed Care – PPO | Admitting: Emergency Medicine

## 2021-06-08 ENCOUNTER — Encounter: Payer: Self-pay | Admitting: Emergency Medicine

## 2021-06-08 ENCOUNTER — Other Ambulatory Visit: Payer: Self-pay | Admitting: Emergency Medicine

## 2021-06-08 VITALS — BP 112/64 | HR 88 | Temp 98.1°F | Ht 73.0 in | Wt 197.0 lb

## 2021-06-08 DIAGNOSIS — I10 Essential (primary) hypertension: Secondary | ICD-10-CM | POA: Diagnosis not present

## 2021-06-08 DIAGNOSIS — N529 Male erectile dysfunction, unspecified: Secondary | ICD-10-CM

## 2021-06-08 DIAGNOSIS — Z1211 Encounter for screening for malignant neoplasm of colon: Secondary | ICD-10-CM

## 2021-06-08 LAB — COMPREHENSIVE METABOLIC PANEL
ALT: 54 U/L — ABNORMAL HIGH (ref 0–53)
AST: 47 U/L — ABNORMAL HIGH (ref 0–37)
Albumin: 4.1 g/dL (ref 3.5–5.2)
Alkaline Phosphatase: 36 U/L — ABNORMAL LOW (ref 39–117)
BUN: 14 mg/dL (ref 6–23)
CO2: 26 mEq/L (ref 19–32)
Calcium: 9.7 mg/dL (ref 8.4–10.5)
Chloride: 97 mEq/L (ref 96–112)
Creatinine, Ser: 0.94 mg/dL (ref 0.40–1.50)
GFR: 95.25 mL/min (ref >60.00)
Glucose, Bld: 90 mg/dL (ref 70–99)
Potassium: 3.7 mEq/L (ref 3.5–5.1)
Sodium: 134 mEq/L — ABNORMAL LOW (ref 135–145)
Total Bilirubin: 0.2 mg/dL (ref 0.2–1.2)
Total Protein: 7.9 g/dL (ref 6.0–8.3)

## 2021-06-08 LAB — LIPID PANEL
Cholesterol: 194 mg/dL (ref 0–200)
HDL: 66.6 mg/dL (ref >39.00)
LDL Cholesterol: 89 mg/dL (ref 0–99)
NonHDL: 127.55
Total CHOL/HDL Ratio: 3
Triglycerides: 192 mg/dL — ABNORMAL HIGH (ref 0.0–149.0)
VLDL: 38.4 mg/dL (ref 0.0–40.0)

## 2021-06-08 LAB — CBC WITH DIFFERENTIAL/PLATELET
Basophils Absolute: 0.1 10*3/uL (ref 0.0–0.1)
Basophils Relative: 1.2 % (ref 0.0–3.0)
Eosinophils Absolute: 0.1 10*3/uL (ref 0.0–0.7)
Eosinophils Relative: 1.7 % (ref 0.0–5.0)
HCT: 36.3 % — ABNORMAL LOW (ref 39.0–52.0)
Hemoglobin: 12.3 g/dL — ABNORMAL LOW (ref 13.0–17.0)
Lymphocytes Relative: 35.1 % (ref 12.0–46.0)
Lymphs Abs: 1.7 10*3/uL (ref 0.7–4.0)
MCHC: 33.8 g/dL (ref 30.0–36.0)
MCV: 97.9 fl (ref 78.0–100.0)
Monocytes Absolute: 0.7 10*3/uL (ref 0.1–1.0)
Monocytes Relative: 13.7 % — ABNORMAL HIGH (ref 3.0–12.0)
Neutro Abs: 2.4 10*3/uL (ref 1.4–7.7)
Neutrophils Relative %: 48.3 % (ref 43.0–77.0)
Platelets: 309 10*3/uL (ref 150.0–400.0)
RBC: 3.71 Mil/uL — ABNORMAL LOW (ref 4.22–5.81)
RDW: 12.5 % (ref 11.5–15.5)
WBC: 4.9 10*3/uL (ref 4.0–10.5)

## 2021-06-08 LAB — HEMOGLOBIN A1C: Hgb A1c MFr Bld: 6.1 % (ref 4.6–6.5)

## 2021-06-08 LAB — TSH: TSH: 1.31 u[IU]/mL (ref 0.35–4.50)

## 2021-06-08 MED ORDER — SILDENAFIL CITRATE 100 MG PO TABS: 50.0000 mg | ORAL_TABLET | Freq: Every day | ORAL | 11 refills | Status: DC | PRN

## 2021-06-08 NOTE — Assessment & Plan Note (Signed)
Well-controlled hypertension Continue Zestoretic 20-12.5 mg daily. 

## 2021-06-08 NOTE — Progress Notes (Signed)
Luis Ward 51 y.o.   Chief Complaint  Patient presents with   Medication Problem    Discuss medication -Lisinopril-Hctz    HISTORY OF PRESENT ILLNESS: This is a 51 y.o. male with history of hypertension on Zestoretic 20-12.5 mg daily.  Here for follow-up.  Doing well with normal blood pressure readings at home. Mostly complaining of decreased sex drive.  Married. Saw urologist last year for back pain radiating to groin area.  Normal examination.  Was referred to physical therapy.  Did several sessions with good results. No other complaints or medical concerns today.  HPI   Prior to Admission medications   Medication Sig Start Date End Date Taking? Authorizing Provider  lisinopril-hydrochlorothiazide (ZESTORETIC) 20-12.5 MG tablet Take 2 tablets by mouth daily. 01/26/21 04/26/21  Luis Pollen, MD    Not on File  Patient Active Problem List   Diagnosis Date Noted   Dyslipidemia 01/26/2021   Essential hypertension 01/15/2019   Chronic left flank pain 10/02/2018    No past medical history on file.  No past surgical history on file.  Social History   Socioeconomic History   Marital status: Married    Spouse name: Not on file   Number of children: Not on file   Years of education: Not on file   Highest education level: Not on file  Occupational History   Not on file  Tobacco Use   Smoking status: Never   Smokeless tobacco: Never  Substance and Sexual Activity   Alcohol use: Yes    Alcohol/week: 0.0 standard drinks   Drug use: No   Sexual activity: Yes  Other Topics Concern   Not on file  Social History Narrative   Not on file   Social Determinants of Health   Financial Resource Strain: Not on file  Food Insecurity: Not on file  Transportation Needs: Not on file  Physical Activity: Not on file  Stress: Not on file  Social Connections: Not on file  Intimate Partner Violence: Not on file    Family History  Problem Relation Age of Onset   Heart  disease Father    Hyperlipidemia Father    Hypertension Father      Review of Systems  Constitutional: Negative.  Negative for chills and fever.  HENT: Negative.  Negative for congestion and sore throat.   Respiratory: Negative.  Negative for cough and shortness of breath.   Cardiovascular: Negative.  Negative for chest pain and palpitations.  Gastrointestinal: Negative.  Negative for abdominal pain, blood in stool, diarrhea, melena, nausea and vomiting.  Genitourinary: Negative.  Negative for dysuria and hematuria.  Musculoskeletal: Negative.  Negative for myalgias.  Skin: Negative.  Negative for rash.  Neurological: Negative.  Negative for dizziness and headaches.  Psychiatric/Behavioral: Negative.    All other systems reviewed and are negative.  Vitals:   06/08/21 1017  BP: 112/64  Pulse: 88  Temp: 98.1 F (36.7 C)  SpO2: 98%   Wt Readings from Last 3 Encounters:  06/08/21 197 lb (89.4 kg)  01/26/21 205 lb (93 kg)  04/27/20 195 lb (88.5 kg)    Physical Exam Vitals reviewed.  Constitutional:      Appearance: Normal appearance.  HENT:     Head: Normocephalic.  Eyes:     Extraocular Movements: Extraocular movements intact.     Pupils: Pupils are equal, round, and reactive to light.  Cardiovascular:     Rate and Rhythm: Normal rate.  Pulmonary:     Effort: Pulmonary  effort is normal.  Musculoskeletal:     Cervical back: Normal range of motion.  Skin:    General: Skin is warm and dry.  Neurological:     General: No focal deficit present.     Mental Status: He is alert and oriented to person, place, and time.  Psychiatric:        Mood and Affect: Mood normal.        Behavior: Behavior normal.     ASSESSMENT & PLAN: Essential hypertension Well-controlled hypertension.  Continue Zestoretic 20-12.5 mg daily.  Erectile dysfunction Normal urologic exam last year. Does get occasional morning erections. Normal ejaculation. Recommend Viagra 50 to 100 mg as  needed before intercourse. Luis Ward was seen today for medication problem.  Diagnoses and all orders for this visit:  Essential hypertension -     CBC with Differential/Platelet -     Comprehensive metabolic panel -     TestT+TestF+SHBG -     TSH -     Hemoglobin A1c -     Lipid panel  Erectile dysfunction, unspecified erectile dysfunction type -     sildenafil (VIAGRA) 100 MG tablet; Take 0.5-1 tablets (50-100 mg total) by mouth daily as needed for erectile dysfunction. -     CBC with Differential/Platelet -     Comprehensive metabolic panel -     TestT+TestF+SHBG -     TSH -     Hemoglobin A1c -     Lipid panel  Patient Instructions  Health Maintenance, Male Adopting a healthy lifestyle and getting preventive care are important in promoting health and wellness. Ask your health care provider about: The right schedule for you to have regular tests and exams. Things you can do on your own to prevent diseases and keep yourself healthy. What should I know about diet, weight, and exercise? Eat a healthy diet Eat a diet that includes plenty of vegetables, fruits, low-fat dairy products, and lean protein. Do not eat a lot of foods that are high in solid fats, added sugars, or sodium.   Maintain a healthy weight Body mass index (BMI) is a measurement that can be used to identify possible weight problems. It estimates body fat based on height and weight. Your health care provider can help determine your BMI and help you achieve or maintain a healthy weight. Get regular exercise Get regular exercise. This is one of the most important things you can do for your health. Most adults should: Exercise for at least 150 minutes each week. The exercise should increase your heart rate and make you sweat (moderate-intensity exercise). Do strengthening exercises at least twice a week. This is in addition to the moderate-intensity exercise. Spend less time sitting. Even light physical activity can be  beneficial. Watch cholesterol and blood lipids Have your blood tested for lipids and cholesterol at 49 years of age, then have this test every 5 years. You may need to have your cholesterol levels checked more often if: Your lipid or cholesterol levels are high. You are older than 49 years of age. You are at high risk for heart disease. What should I know about cancer screening? Many types of cancers can be detected early and may often be prevented. Depending on your health history and family history, you may need to have cancer screening at various ages. This may include screening for: Colorectal cancer. Prostate cancer. Skin cancer. Lung cancer. What should I know about heart disease, diabetes, and high blood pressure? Blood pressure and heart disease  High blood pressure causes heart disease and increases the risk of stroke. This is more likely to develop in people who have high blood pressure readings, are of African descent, or are overweight. Talk with your health care provider about your target blood pressure readings. Have your blood pressure checked: Every 3-5 years if you are 30-82 years of age. Every year if you are 71 years old or older. If you are between the ages of 76 and 42 and are a current or former smoker, ask your health care provider if you should have a one-time screening for abdominal aortic aneurysm (AAA). Diabetes Have regular diabetes screenings. This checks your fasting blood sugar level. Have the screening done: Once every three years after age 8 if you are at a normal weight and have a low risk for diabetes. More often and at a younger age if you are overweight or have a high risk for diabetes. What should I know about preventing infection? Hepatitis B If you have a higher risk for hepatitis B, you should be screened for this virus. Talk with your health care provider to find out if you are at risk for hepatitis B infection. Hepatitis C Blood testing is  recommended for: Everyone born from [birth year] through 1963. Anyone with known risk factors for hepatitis C. Sexually transmitted infections (STIs) You should be screened each year for STIs, including gonorrhea and chlamydia, if: You are sexually active and are younger than 49 years of age. You are older than 49 years of age and your health care provider tells you that you are at risk for this type of infection. Your sexual activity has changed since you were last screened, and you are at increased risk for chlamydia or gonorrhea. Ask your health care provider if you are at risk. Ask your health care provider about whether you are at high risk for HIV. Your health care provider may recommend a prescription medicine to help prevent HIV infection. If you choose to take medicine to prevent HIV, you should first get tested for HIV. You should then be tested every 3 months for as long as you are taking the medicine. Follow these instructions at home: Lifestyle Do not use any products that contain nicotine or tobacco, such as cigarettes, e-cigarettes, and chewing tobacco. If you need help quitting, ask your health care provider. Do not use street drugs. Do not share needles. Ask your health care provider for help if you need support or information about quitting drugs. Alcohol use Do not drink alcohol if your health care provider tells you not to drink. If you drink alcohol: Limit how much you have to 0-2 drinks a day. Be aware of how much alcohol is in your drink. In the [place] one drink equals one 12 oz bottle of beer (355 mL), one 5 oz glass of wine (148 mL), or one 1 oz glass of hard liquor (44 mL). General instructions Schedule regular health, dental, and eye exams. Stay current with your vaccines. Tell your health care provider if: You often feel depressed. You have ever been abused or do not feel safe at home. Summary Adopting a healthy lifestyle and getting preventive care are important in  promoting health and wellness. Follow your health care provider's instructions about healthy diet, exercising, and getting tested or screened for diseases. Follow your health care provider's instructions on monitoring your cholesterol and blood pressure. This information is not intended to replace advice given to you by your health care provider. Make sure  you discuss any questions you have with your health care provider. Document Revised: 12/10/2018 Document Reviewed: 12/10/2018 Elsevier Patient Education  2021 [place], [place] [facility]

## 2021-06-08 NOTE — Patient Instructions (Signed)

## 2021-06-08 NOTE — Assessment & Plan Note (Signed)
Normal urologic exam last year. Does get occasional morning erections. Normal ejaculation. Recommend Viagra 50 to 100 mg as needed before intercourse.

## 2021-06-18 LAB — TESTT+TESTF+SHBG
Sex Hormone Binding: 40.5 nmol/L (ref 16.5–55.9)
Testosterone, Free: 10 pg/mL (ref 6.8–21.5)
Testosterone, Total, LC/MS: 298.3 ng/dL (ref 264.0–916.0)

## 2021-09-29 ENCOUNTER — Encounter: Payer: Self-pay | Admitting: Emergency Medicine

## 2021-10-31 ENCOUNTER — Telehealth: Payer: Self-pay | Admitting: Emergency Medicine

## 2021-10-31 DIAGNOSIS — G4730 Sleep apnea, unspecified: Secondary | ICD-10-CM

## 2021-10-31 NOTE — Telephone Encounter (Signed)
Patient calling to request a referral for a sleep study  Advised patient to make a referral appt, patient declined  Patient is requesting a call back to discuss referral

## 2022-02-23 ENCOUNTER — Other Ambulatory Visit: Payer: Self-pay | Admitting: Emergency Medicine

## 2022-02-23 DIAGNOSIS — I10 Essential (primary) hypertension: Secondary | ICD-10-CM

## 2022-03-10 ENCOUNTER — Encounter (HOSPITAL_BASED_OUTPATIENT_CLINIC_OR_DEPARTMENT_OTHER): Payer: Self-pay

## 2022-03-10 ENCOUNTER — Other Ambulatory Visit: Payer: Self-pay

## 2022-03-10 ENCOUNTER — Emergency Department (HOSPITAL_BASED_OUTPATIENT_CLINIC_OR_DEPARTMENT_OTHER)
Admission: EM | Admit: 2022-03-10 | Discharge: 2022-03-10 | Disposition: A | Discharge status: Home / Self Care | Payer: Commercial Managed Care - PPO | Attending: Emergency Medicine | Admitting: Emergency Medicine

## 2022-03-10 ENCOUNTER — Emergency Department (HOSPITAL_BASED_OUTPATIENT_CLINIC_OR_DEPARTMENT_OTHER): Payer: Commercial Managed Care - PPO

## 2022-03-10 DIAGNOSIS — K625 Hemorrhage of anus and rectum: Secondary | ICD-10-CM | POA: Insufficient documentation

## 2022-03-10 DIAGNOSIS — K529 Noninfective gastroenteritis and colitis, unspecified: Secondary | ICD-10-CM | POA: Insufficient documentation

## 2022-03-10 LAB — CBC WITH DIFFERENTIAL/PLATELET
Abs Immature Granulocytes: 0.04 10*3/uL (ref 0.00–0.07)
Basophils Absolute: 0 10*3/uL (ref 0.0–0.1)
Basophils Relative: 0 %
Eosinophils Absolute: 0 10*3/uL (ref 0.0–0.5)
Eosinophils Relative: 0 %
HCT: 37.5 % — ABNORMAL LOW (ref 39.0–52.0)
Hemoglobin: 12.8 g/dL — ABNORMAL LOW (ref 13.0–17.0)
Immature Granulocytes: 0 %
Lymphocytes Relative: 13 %
Lymphs Abs: 1.6 10*3/uL (ref 0.7–4.0)
MCH: 32.3 pg (ref 26.0–34.0)
MCHC: 34.1 g/dL (ref 30.0–36.0)
MCV: 94.7 fL (ref 80.0–100.0)
Monocytes Absolute: 0.9 10*3/uL (ref 0.1–1.0)
Monocytes Relative: 7 %
Neutro Abs: 9.9 10*3/uL — ABNORMAL HIGH (ref 1.7–7.7)
Neutrophils Relative %: 80 %
Platelets: 286 10*3/uL (ref 150–400)
RBC: 3.96 MIL/uL — ABNORMAL LOW (ref 4.22–5.81)
RDW: 12.1 % (ref 11.5–15.5)
WBC: 12.5 10*3/uL — ABNORMAL HIGH (ref 4.0–10.5)
nRBC: 0 % (ref 0.0–0.2)

## 2022-03-10 LAB — COMPREHENSIVE METABOLIC PANEL
ALT: 55 U/L — ABNORMAL HIGH (ref 0–44)
AST: 41 U/L (ref 15–41)
Albumin: 4.4 g/dL (ref 3.5–5.0)
Alkaline Phosphatase: 32 U/L — ABNORMAL LOW (ref 38–126)
Anion gap: 15 (ref 5–15)
BUN: 21 mg/dL — ABNORMAL HIGH (ref 6–20)
CO2: 21 mmol/L — ABNORMAL LOW (ref 22–32)
Calcium: 9.7 mg/dL (ref 8.9–10.3)
Chloride: 93 mmol/L — ABNORMAL LOW (ref 98–111)
Creatinine, Ser: 1.03 mg/dL (ref 0.61–1.24)
GFR, Estimated: >60 mL/min (ref >60)
Glucose, Bld: 91 mg/dL (ref 70–99)
Potassium: 4.1 mmol/L (ref 3.5–5.1)
Sodium: 129 mmol/L — ABNORMAL LOW (ref 135–145)
Total Bilirubin: 0.5 mg/dL (ref 0.3–1.2)
Total Protein: 8.3 g/dL — ABNORMAL HIGH (ref 6.5–8.1)

## 2022-03-10 LAB — LIPASE, BLOOD: Lipase: 49 U/L (ref 11–51)

## 2022-03-10 MED ORDER — IOHEXOL 300 MG/ML  SOLN
100.0000 mL | Freq: Once | INTRAMUSCULAR | Status: AC | PRN
  Administered 2022-03-10: 100 mL via INTRAVENOUS

## 2022-03-10 MED ORDER — SODIUM CHLORIDE 0.9 % IV BOLUS
1000.0000 mL | Freq: Once | INTRAVENOUS | Status: AC
  Administered 2022-03-10: 1000 mL via INTRAVENOUS

## 2022-03-10 MED ORDER — AMOXICILLIN-POT CLAVULANATE 875-125 MG PO TABS: 1.0000 | ORAL_TABLET | Freq: Two times a day (BID) | ORAL | 0 refills | Status: DC

## 2022-03-10 NOTE — ED Triage Notes (Signed)
He denies abd. Pain and fever. He tells me he has had several episodes of regular and "soft stools'' since last night. He tells me that "after I evacuated, then some liquid blood came out". He tells me he has never done this before. He states his appetite is normal, however he hasn't eaten today d/t possible testing/intervention. ?

## 2022-03-10 NOTE — ED Notes (Signed)
Provider no longer needed the occult card, saw pictures instead. ?

## 2022-03-10 NOTE — ED Provider Notes (Signed)
[facility] EMERGENCY DEPT Provider Note   CSN: [id number] Arrival date & time: 03/10/22  1111     History  Chief Complaint  Patient presents with   Rectal Bleeding   Diarrhea    Luis Ward is a 52 y.o. male.  Has had lower abdominal cramping since around 1 AM.  Having diarrhea, some blood in his stool.  No large amount of blood clots.  Seems to be getting better.  Denies any history of inflammatory bowel disease.  No obvious sick contacts or suspicious food intake.  Does not take blood thinners.  The history is provided by the patient.  Abdominal Cramping This is a new problem. The current episode started 6 to 12 hours ago. The problem has been gradually improving. Associated symptoms include abdominal pain. Pertinent negatives include no chest pain, no headaches and no shortness of breath. Nothing aggravates the symptoms. Nothing relieves the symptoms. He has tried nothing for the symptoms. The treatment provided no relief.      Home Medications Prior to Admission medications   Medication Sig Start Date End Date Taking? Authorizing Provider  amoxicillin-clavulanate (AUGMENTIN) 875-125 MG tablet Take 1 tablet by mouth every 12 (twelve) hours. 03/10/22  Yes Emmali Karow, DO  lisinopril-hydrochlorothiazide (ZESTORETIC) 20-12.5 MG tablet TAKE 2 TABLETS BY MOUTH DAILY 02/23/22 05/24/22  Horald Pollen, MD  sildenafil (VIAGRA) 100 MG tablet Take 0.5-1 tablets (50-100 mg total) by mouth daily as needed for erectile dysfunction. 06/08/21   Horald Pollen, MD      Allergies    Patient has no allergy information on record.    Review of Systems   Review of Systems  Respiratory:  Negative for shortness of breath.   Cardiovascular:  Negative for chest pain.  Gastrointestinal:  Positive for abdominal pain.  Neurological:  Negative for headaches.   Physical Exam Updated Vital Signs  ED Triage Vitals  Enc Vitals Group     BP 03/10/22 1120 (!) 179/114      Pulse Rate 03/10/22 1120 100     Resp 03/10/22 1120 18     Temp 03/10/22 1120 98.3 F (36.8 C)     Temp src --      SpO2 03/10/22 1120 100 %     Weight --      Height --      Head Circumference --      Peak Flow --      Pain Score 03/10/22 1123 0     Pain Loc --      Pain Edu? --      Excl. in [place]? --     Physical Exam Vitals and nursing note reviewed.  Constitutional:      General: He is not in acute distress.    Appearance: He is well-developed. He is not ill-appearing.  HENT:     Head: Normocephalic and atraumatic.     Nose: Nose normal.     Mouth/Throat:     Mouth: Mucous membranes are moist.  Eyes:     Extraocular Movements: Extraocular movements intact.     Conjunctiva/sclera: Conjunctivae normal.     Pupils: Pupils are equal, round, and reactive to light.  Cardiovascular:     Rate and Rhythm: Normal rate and regular rhythm.     Pulses: Normal pulses.     Heart sounds: Normal heart sounds. No murmur heard. Pulmonary:     Effort: Pulmonary effort is normal. No respiratory distress.     Breath sounds: Normal breath  sounds.  Abdominal:     Palpations: Abdomen is soft.     Tenderness: There is abdominal tenderness (llq).  Genitourinary:    Rectum: Guaiac result positive.     Comments: Stool looks grossly brown but there is some reddish tinge Musculoskeletal:        General: No swelling.     Cervical back: Neck supple.  Skin:    General: Skin is warm and dry.     Capillary Refill: Capillary refill takes less than 2 seconds.  Neurological:     Mental Status: He is alert.  Psychiatric:        Mood and Affect: Mood normal.    ED Results / Procedures / Treatments   Labs (all labs ordered are listed, but only abnormal results are displayed) Labs Reviewed  CBC WITH DIFFERENTIAL/PLATELET - Abnormal; Notable for the following components:      Result Value   WBC 12.5 (*)    RBC 3.96 (*)    Hemoglobin 12.8 (*)    HCT 37.5 (*)    Neutro Abs 9.9 (*)    All other  components within normal limits  COMPREHENSIVE METABOLIC PANEL - Abnormal; Notable for the following components:   Sodium 129 (*)    Chloride 93 (*)    CO2 21 (*)    BUN 21 (*)    Total Protein 8.3 (*)    ALT 55 (*)    Alkaline Phosphatase 32 (*)    All other components within normal limits  LIPASE, BLOOD    EKG None  Radiology CT ABDOMEN PELVIS W CONTRAST  Result Date: 03/10/2022 CLINICAL DATA:  52 year old male with acute abdominal pelvic pain and diarrhea. EXAM: CT ABDOMEN AND PELVIS WITH CONTRAST TECHNIQUE: Multidetector CT imaging of the abdomen and pelvis was performed using the standard protocol following bolus administration of intravenous contrast. RADIATION DOSE REDUCTION: This exam was performed according to the departmental dose-optimization program which includes automated exposure control, adjustment of the mA and/or kV according to patient size and/or use of iterative reconstruction technique. CONTRAST:  153m OMNIPAQUE IOHEXOL 300 MG/ML  SOLN COMPARISON:  None FINDINGS: Lower chest: No acute abnormality Hepatobiliary: Hepatic steatosis identified without focal hepatic abnormality. The gallbladder is unremarkable. There is no evidence of intrahepatic or extrahepatic biliary dilatation. Pancreas: Unremarkable Spleen: Unremarkable Adrenals/Urinary Tract: The kidneys, adrenal glands and bladder are unremarkable. Stomach/Bowel: Circumferential wall thickening of the transverse and descending colon noted with mild adjacent inflammation, compatible with colitis. There is no evidence of bowel obstruction or pneumoperitoneum. A few scattered colonic diverticula are noted. The mesenteric arteries and veins appear patent. No other bowel wall thickening or inflammation noted. The appendix is normal. Vascular/Lymphatic: Aortic atherosclerosis. No enlarged abdominal or pelvic lymph nodes. Reproductive: Prostate is unremarkable. Other: No ascites, focal collection or pneumoperitoneum.  Musculoskeletal: No acute or suspicious bony abnormalities are noted. IMPRESSION: 1. Colitis involving the transverse and descending colon. No evidence of bowel obstruction, pneumoperitoneum or abscess. 2. Hepatic steatosis. 3. Aortic Atherosclerosis (ICD10-I70.0). Electronically Signed   By: JMargarette CanadaM.D.   On: 03/10/2022 13:06    Procedures Procedures    Medications Ordered in ED Medications  sodium chloride 0.9 % bolus 1,000 mL (1,000 mLs Intravenous New Bag/Given 03/10/22 1142)  iohexol (OMNIPAQUE) 300 MG/ML solution 100 mL (100 mLs Intravenous Contrast Given 03/10/22 1233)    ED Course/ Medical Decision Making/ A&P  Medical Decision Making Amount and/or Complexity of Data Reviewed Labs: ordered. Radiology: ordered.  Risk Prescription drug management.   UNO ESAU is here with diarrhea, blood in his stool.  Unremarkable vitals.  No fever.  Several episodes of diarrhea with bloody mixture here over the last 12 hours.  Overall improving.  Having some lower abdominal cramping still.  Denies any known sick contacts or suspicious food intake.  He is not on blood thinners.  No history of inflammatory bowel disease or other significant abdominal issues.  Does not seem to be large volume hematochezia.  He has a picture on the phone that shows some blood-tinged water and mostly loose stool that appears to be grossly brown.  Differential diagnosis is likely colitis, seems less likely diverticular bleeding.  He has no major rectal pain but could be polyp or hemorrhoid as well.  Overall have much lower suspicion for GI bleed.  We will get CBC, CMP, CT scan abdomen pelvis.  Per review of lab work and my interpretation there is mild leukocytosis but no significant anemia or electrolyte abnormality.  CT scan of abdomen and pelvis per radiology review shows colitis of the transverse and descending colon.  This is likely the cause of his symptoms today.  Overall he is very  well-appearing.  We will start him on antibiotics.  We will have him follow-up with GI doctor.  Discharged in good condition.  This chart was dictated using voice recognition software.  Despite best efforts to proofread,  errors can occur which can change the documentation meaning.         Final Clinical Impression(s) / ED Diagnoses Final diagnoses:  Colitis    Rx / DC Orders ED Discharge Orders          Ordered    amoxicillin-clavulanate (AUGMENTIN) 875-125 MG tablet  Every 12 hours        03/10/22 1310              Beling[place], Bellamy Rubey, DO 03/10/22 1312

## 2022-03-10 NOTE — ED Notes (Addendum)
Hemocult card left in the room. Provider notified. ?

## 2022-03-12 ENCOUNTER — Telehealth: Payer: Self-pay | Admitting: Emergency Medicine

## 2022-03-12 DIAGNOSIS — K529 Noninfective gastroenteritis and colitis, unspecified: Secondary | ICD-10-CM

## 2022-03-12 NOTE — Telephone Encounter (Signed)
Called patient and left message that his referral to GI was placed.  ?

## 2022-03-12 NOTE — Telephone Encounter (Signed)
Patient calling in ? ?Patient requesting provider submit referral to GI ? ?Patient was seen in ED 03/11 & would like for provider to review visit notes ? ?Please let patient know when referral has been submitted [phone] ?

## 2022-03-12 NOTE — Telephone Encounter (Signed)
It is okay to place for evaluation of colitis as shown on recent CT scan of abdomen and pelvis.  Thanks.

## 2022-03-23 ENCOUNTER — Encounter: Payer: Self-pay | Admitting: Nurse Practitioner

## 2022-03-23 ENCOUNTER — Ambulatory Visit (INDEPENDENT_AMBULATORY_CARE_PROVIDER_SITE_OTHER): Payer: Commercial Managed Care - PPO | Admitting: Nurse Practitioner

## 2022-03-23 VITALS — BP 120/82 | HR 83 | Ht 73.0 in | Wt 192.2 lb

## 2022-03-23 DIAGNOSIS — R933 Abnormal findings on diagnostic imaging of other parts of digestive tract: Secondary | ICD-10-CM | POA: Diagnosis not present

## 2022-03-23 DIAGNOSIS — Z1211 Encounter for screening for malignant neoplasm of colon: Secondary | ICD-10-CM | POA: Diagnosis not present

## 2022-03-23 DIAGNOSIS — D649 Anemia, unspecified: Secondary | ICD-10-CM | POA: Diagnosis not present

## 2022-03-23 NOTE — Patient Instructions (Signed)
If you are age 50 or older, your body mass index should be between 23-30. Your Body mass index is 25.36 kg/m?Marland Kitchen If this is out of the aforementioned range listed, please consider follow up with your Primary Care Provider. ? ?If you are age 18 or younger, your body mass index should be between 19-25. Your Body mass index is 25.36 kg/m?Marland Kitchen If this is out of the aformentioned range listed, please consider follow up with your Primary Care Provider.  ? ?You have been scheduled for a colonoscopy. Please follow written instructions given to you at your visit today.  ?Please pick up your prep supplies at the pharmacy within the next 1-3 days. ?If you use inhalers (even only as needed), please bring them with you on the day of your procedure. ? ? ?The  GI providers would like to encourage you to use [facility] to communicate with providers for non-urgent requests or questions.  Due to long hold times on the telephone, sending your provider a message by [facility] may be a faster and more efficient way to get a response.  Please allow 48 business hours for a response.  Please remember that this is for non-urgent requests.  ? ?It was a pleasure to see you today! ? ?Thank you for trusting me with your gastrointestinal care!   ? ?[name], NP ? ?

## 2022-03-23 NOTE — Progress Notes (Signed)
? ? ?Assessment :   ?Recent abdominal pain / diarrhea with blood / colitis on CT scan - symptoms were acute and quickly resolved with antibiotics. Suspect he had infectious colitis.  ?Mild [place] anemia - Hgb 12.8 in ED (in setting of rectal bleeding).   ?Colon cancer screening.  ? ?Plan:    ? ?Schedule for a screening colonoscopy to be done early May. The risks and benefits of colonoscopy with possible polypectomy / biopsies were discussed and the patient agrees to proceed.  ?After patient left clinic I noticed that he was also anemic in June 2022  Hgb is stable, slightly improved ( 12.3 >> 12.8) but given persistent anemia will arrange for iron studies, tTg, Iga ? ?History of Present Illness:  ? ?Patient profile:  ?Luis Ward is a 52 y.o. male new to practice with a past medical history significant for HTN and hepatic steatosis, diverticuli. See PMH below for any additional history. Referred by PCP for colitis ? ?Chief complaint:  ? ?03/10/22 ED visit for diarrhea and rectal bleeding.  ?Went to bed feeling fine on 3/10. Developed lower abdominal pain and diarrhea around 1 am . This was followed by passage of blood several hours later. He and wife had same thing for dinner but she didn't get sick. No recent camping, fishing, hiking etc.. No recent out of country travel.  WBC 12.5 hgb 12.8, Na 129, ALT 55. Lipase 49. CTAP revealed hepatic steatosis, bowel wall thickening of transverse and descending portion of colon, scattered diverticuli.  Discharged from ED on Amoxicillin. Symptoms resolved almost immediately after starting antibiotics. No further bleeding, BMs back to normal. No upper GI symptoms.  ? ?No [place] of IBD or colon cancer. He was referred to [place] in June for anemia and screening colonoscopy but didn't want to have it done.  Hgb at that time was 12.3 ( baseline 14.9). MCV 97.  ? ? ?Previous Labs / Imaging:: ? ?  Latest Ref Rng & Units 03/10/2022  ? 11:11 AM 06/08/2021  ? 10:58 AM 01/15/2019  ?  9:54 AM  ?CBC   ?WBC 4.0 - 10.5 K/uL 12.5   4.9   5.5    ?Hemoglobin 13.0 - 17.0 g/dL 12.8   12.3   14.9    ?Hematocrit 39.0 - 52.0 % 37.5   36.3   44.3    ?Platelets 150 - 400 K/uL 286   309.0   257    ? ? ?Lab Results  ?Component Value Date  ? LIPASE 49 03/10/2022  ? ? ?  Latest Ref Rng & Units 03/10/2022  ? 11:11 AM 06/08/2021  ? 10:58 AM 01/15/2019  ?  9:54 AM  ?CMP  ?Glucose 70 - 99 mg/dL 91   90   77    ?BUN 6 - 20 mg/dL '21   14   9    '$ ?Creatinine 0.61 - 1.24 mg/dL 1.03   0.94   0.84    ?Sodium 135 - 145 mmol/L 129   134   138    ?Potassium 3.5 - 5.1 mmol/L 4.1   3.7   4.6    ?Chloride 98 - 111 mmol/L 93   97   101    ?CO2 22 - 32 mmol/L '21   26   20    '$ ?Calcium 8.9 - 10.3 mg/dL 9.7   9.7   9.6    ?Total Protein 6.5 - 8.1 g/dL 8.3   7.9   7.6    ?Total Bilirubin  0.3 - 1.2 mg/dL 0.5   0.2   0.3    ?Alkaline Phos 38 - 126 U/L 32   36   46    ?AST 15 - 41 U/L 41   47   27    ?ALT 0 - 44 U/L 55   54   30    ? ? ?Previous GI Evaluations;   ? ?Endoscopies: ?none ? ? ?Imaging:  ?CT ABDOMEN PELVIS W CONTRAST ?CLINICAL DATA:  52 year old male with acute abdominal pelvic pain ?and diarrhea. ? ?EXAM: ?CT ABDOMEN AND PELVIS WITH CONTRAST ? ?TECHNIQUE: ?Multidetector CT imaging of the abdomen and pelvis was performed ?using the standard protocol following bolus administration of ?intravenous contrast. ? ?RADIATION DOSE REDUCTION: This exam was performed according to the ?departmental dose-optimization program which includes automated ?exposure control, adjustment of the mA and/or kV according to ?patient size and/or use of iterative reconstruction technique. ? ?CONTRAST:  119m OMNIPAQUE IOHEXOL 300 MG/ML  SOLN ? ?COMPARISON:  None ? ?FINDINGS: ?Lower chest: No acute abnormality ? ?Hepatobiliary: Hepatic steatosis identified without focal hepatic ?abnormality. The gallbladder is unremarkable. There is no evidence ?of intrahepatic or extrahepatic biliary dilatation. ? ?Pancreas: Unremarkable ? ?Spleen: Unremarkable ? ?Adrenals/Urinary Tract:  The kidneys, adrenal glands and bladder are ?unremarkable. ? ?Stomach/Bowel: Circumferential wall thickening of the transverse and ?descending colon noted with mild adjacent inflammation, compatible ?with colitis. There is no evidence of bowel obstruction or ?pneumoperitoneum. A few scattered colonic diverticula are noted. The ?mesenteric arteries and veins appear patent. ? ?No other bowel wall thickening or inflammation noted. The appendix ?is normal. ? ?Vascular/Lymphatic: Aortic atherosclerosis. No enlarged abdominal or ?pelvic lymph nodes. ? ?Reproductive: Prostate is unremarkable. ? ?Other: No ascites, focal collection or pneumoperitoneum. ? ?Musculoskeletal: No acute or suspicious bony abnormalities are ?noted. ? ?IMPRESSION: ?1. Colitis involving the transverse and descending colon. No ?evidence of bowel obstruction, pneumoperitoneum or abscess. ?2. Hepatic steatosis. ?3. Aortic Atherosclerosis (ICD10-I70.0). ? ?Electronically Signed ?  By: JMargarette CanadaM.D. ?  On: 03/10/2022 13:06 ? ? ?Past Medical History:  ?Diagnosis Date  ? ED (erectile dysfunction)   ? Hypertension   ? ?Past Surgical History:  ?Procedure Laterality Date  ? NO PAST SURGERIES    ? ?Family History  ?Problem Relation Age of Onset  ? Heart disease Father   ? Hyperlipidemia Father   ? Hypertension Father   ? Diabetes Father   ? Colon cancer Neg Hx   ? Colon polyps Neg Hx   ? Stomach cancer Neg Hx   ? Esophageal cancer Neg Hx   ? Pancreatic cancer Neg Hx   ? ?Social History  ? ?Tobacco Use  ? Smoking status: Former  ?  Types: Cigarettes  ? Smokeless tobacco: Never  ?Vaping Use  ? Vaping Use: Never used  ?Substance Use Topics  ? Alcohol use: Yes  ?  Comment: everyday between 3-6 drinks  ? Drug use: No  ? ?Current Outpatient Medications  ?Medication Sig Dispense Refill  ? lisinopril-hydrochlorothiazide (ZESTORETIC) 20-12.5 MG tablet TAKE 2 TABLETS BY MOUTH DAILY 180 tablet 3  ? sildenafil (VIAGRA) 100 MG tablet Take 0.5-1 tablets (50-100 mg total)  by mouth daily as needed for erectile dysfunction. 5 tablet 11  ? ?No current facility-administered medications for this visit.  ? ?Not on File ? ? ?Review of Systems: ?All systems reviewed and negative except where noted in HPI.  ? ?Physical Exam:   ? ?Wt Readings from Last 3 Encounters:  ?03/23/22 192 lb 3.2 oz (  87.2 kg)  ?06/08/21 197 lb (89.4 kg)  ?01/26/21 205 lb (93 kg)  ? ? ?BP 120/82   Pulse 83   Ht '6\' 1"'$  (1.854 m)   Wt 192 lb 3.2 oz (87.2 kg)   SpO2 98%   BMI 25.36 kg/m?  ?Constitutional:  Generally well appearing male in no acute distress. ?Psychiatric: Pleasant. Normal mood and affect. Behavior is normal. ?EENT: Pupils normal.  Conjunctivae are normal. No scleral icterus. ?Neck supple.  ?Cardiovascular: Normal rate, regular rhythm. No edema ?Pulmonary/chest: Effort normal and breath sounds normal. No wheezing, rales or rhonchi. ?Abdominal: Soft, nondistended, nontender. Bowel sounds active throughout. There are no masses palpable. No hepatomegaly. ?Neurological: Alert and oriented to person place and time. ?Skin: Skin is warm and dry. No rashes noted. ? ?[name], NP  03/23/2022, 10:46 AM ? ?Cc:  ?Referring Provider ?[place], [place]

## 2022-03-25 NOTE — Progress Notes (Signed)
Reviewed.  Kasch Borquez L. Denny Mccree, MD, MPH  

## 2022-05-02 ENCOUNTER — Encounter: Payer: Self-pay | Admitting: Gastroenterology

## 2022-05-09 ENCOUNTER — Encounter: Payer: Self-pay | Admitting: Gastroenterology

## 2022-05-09 ENCOUNTER — Ambulatory Visit (AMBULATORY_SURGERY_CENTER): Payer: Commercial Managed Care - PPO | Admitting: Gastroenterology

## 2022-05-09 VITALS — BP 110/68 | HR 70 | Temp 98.2°F | Resp 14 | Ht 73.0 in | Wt 192.0 lb

## 2022-05-09 DIAGNOSIS — K621 Rectal polyp: Secondary | ICD-10-CM

## 2022-05-09 DIAGNOSIS — D122 Benign neoplasm of ascending colon: Secondary | ICD-10-CM

## 2022-05-09 DIAGNOSIS — D128 Benign neoplasm of rectum: Secondary | ICD-10-CM

## 2022-05-09 DIAGNOSIS — R9389 Abnormal findings on diagnostic imaging of other specified body structures: Secondary | ICD-10-CM

## 2022-05-09 DIAGNOSIS — Z1211 Encounter for screening for malignant neoplasm of colon: Secondary | ICD-10-CM

## 2022-05-09 MED ORDER — SODIUM CHLORIDE 0.9 % IV SOLN: 500.0000 mL | INTRAVENOUS | Status: DC

## 2022-05-09 NOTE — Op Note (Signed)
[place] ?Patient Name: Luis Ward ?Procedure Date: 05/09/2022 12:51 PM ?MRN: [id number] ?Endoscopist: Thornton Park MD, MD ?Age: 52 ?Referring MD:  ?Date of Birth: 05/09/1970 ?Gender: Male ?Account #: [id number] ?Procedure:                Colonoscopy ?Indications:              Screening for colorectal malignant neoplasm, This  ?                          is the patient's first colonoscopy ?                          No known family history of colon cancer or polyps ?Medicines:                Monitored Anesthesia Care ?Procedure:                Pre-Anesthesia Assessment: ?                          - Prior to the procedure, a History and Physical  ?                          was performed, and patient medications and  ?                          allergies were reviewed. The patient's tolerance of  ?                          previous anesthesia was also reviewed. The risks  ?                          and benefits of the procedure and the sedation  ?                          options and risks were discussed with the patient.  ?                          All questions were answered, and informed consent  ?                          was obtained. Prior Anticoagulants: The patient has  ?                          taken no previous anticoagulant or antiplatelet  ?                          agents. ASA Grade Assessment: II - A patient with  ?                          mild systemic disease. After reviewing the risks  ?                          and benefits, the patient was deemed in  ?  satisfactory condition to undergo the procedure. ?                          After obtaining informed consent, the colonoscope  ?                          was passed under direct vision. Throughout the  ?                          procedure, the patient's blood pressure, pulse, and  ?                          oxygen saturations were monitored continuously. The  ?                          CF [id number] #[id number] was  introduced through the anus  ?                          and advanced to the 3 cm into the ileum. A second  ?                          forward view of the right colon was performed. The  ?                          colonoscopy was performed without difficulty. The  ?                          patient tolerated the procedure well. The quality  ?                          of the bowel preparation was good. The terminal  ?                          ileum, ileocecal valve, appendiceal orifice, and  ?                          rectum were photographed. ?Scope In: 1:47:06 PM ?Scope Out: 2:05:04 PM ?Scope Withdrawal Time: 0 hours 14 minutes 59 seconds  ?Total Procedure Duration: 0 hours 17 minutes 58 seconds  ?Findings:                 The perianal and digital rectal examinations were  ?                          normal. ?                          A few small and large-mouthed diverticula were  ?                          found in the sigmoid colon, descending colon and  ?                          ascending colon. ?  A 2 mm polyp was found in the rectum. The polyp was  ?                          sessile. The polyp was removed with a cold snare.  ?                          Resection and retrieval were complete. Estimated  ?                          blood loss was minimal. ?                          A 3 mm polyp was found in the proximal ascending  ?                          colon. The polyp was flat. The polyp was removed  ?                          with a cold snare. Resection and retrieval were  ?                          complete. Estimated blood loss was minimal. ?                          The exam was otherwise without abnormality on  ?                          direct and retroflexion views. ?Complications:            No immediate complications. ?Estimated Blood Loss:     Estimated blood loss was minimal. ?Impression:               - Diverticulosis in the sigmoid colon, in the  ?                           descending colon and in the ascending colon. ?                          - One 2 mm polyp in the rectum, removed with a cold  ?                          snare. Resected and retrieved. ?                          - One 3 mm polyp in the proximal ascending colon,  ?                          removed with a cold snare. Resected and retrieved. ?                          - The examination was otherwise normal on direct  ?  and retroflexion views. ?Recommendation:           - Patient has a contact number available for  ?                          emergencies. The signs and symptoms of potential  ?                          delayed complications were discussed with the  ?                          patient. Return to normal activities tomorrow.  ?                          Written discharge instructions were provided to the  ?                          patient. ?                          - Continue present medications. ?                          - Await pathology results. ?                          - Repeat colonoscopy date to be determined after  ?                          pending pathology results are reviewed for  ?                          surveillance. ?                          - Follow a high fiber diet. Drink at least 64  ?                          ounces of water daily. Add a daily stool bulking  ?                          agent such as psyllium (an exampled would be  ?                          Metamucil). ?                          - Emerging evidence supports eating a diet of  ?                          fruits, vegetables, grains, calcium, and yogurt  ?                          while reducing red meat and alcohol may reduce the  ?  risk of colon cancer. ?                          - Thank you for allowing me to be involved in your  ?                          colon cancer prevention. ?Thornton Park MD, MD ?05/09/2022 2:11:05 PM ?This report has been signed  electronically. ?

## 2022-05-09 NOTE — Progress Notes (Signed)
Called to room to assist during endoscopic procedure.  Patient ID and intended procedure confirmed with present staff. Received instructions for my participation in the procedure from the performing physician.  

## 2022-05-09 NOTE — Progress Notes (Signed)
PT taken to PACU. Monitors in place. VSS. Report given to RN. 

## 2022-05-09 NOTE — Progress Notes (Signed)
Pt's states no medical or surgical changes since previsit or office visit. 

## 2022-05-09 NOTE — Patient Instructions (Addendum)
Handout on Polyps, high fiber diet and diverticulosis provided  ? ?Await pathology results.  ? ?Continue current medications.  ? ?Follow a high fiber diet. Drink at least 64 ounces of water daily. Add a daily stool bulking agent such as psyllium (an exampled would be Metamucil). ? ?YOU HAD AN ENDOSCOPIC PROCEDURE TODAY AT [facility]:   Refer to the procedure report that was given to you for any specific questions about what was found during the examination.  If the procedure report does not answer your questions, please call your gastroenterologist to clarify.  If you requested that your care partner not be given the details of your procedure findings, then the procedure report has been included in a sealed envelope for you to review at your convenience later. ? ?YOU SHOULD EXPECT: Some feelings of bloating in the abdomen. Passage of more gas than usual.  Walking can help get rid of the air that was put into your GI tract during the procedure and reduce the bloating. If you had a lower endoscopy (such as a colonoscopy or flexible sigmoidoscopy) you may notice spotting of blood in your stool or on the toilet paper. If you underwent a bowel prep for your procedure, you may not have a normal bowel movement for a few days. ? ?Please Note:  You might notice some irritation and congestion in your nose or some drainage.  This is from the oxygen used during your procedure.  There is no need for concern and it should clear up in a day or so. ? ?SYMPTOMS TO REPORT IMMEDIATELY: ? ?Following lower endoscopy (colonoscopy or flexible sigmoidoscopy): ? Excessive amounts of blood in the stool ? Significant tenderness or worsening of abdominal pains ? Swelling of the abdomen that is new, acute ? Fever of 100?F or higher ? ?For urgent or emergent issues, a gastroenterologist can be reached at any hour by calling [phone]. ?Do not use MyChart messaging for urgent concerns.  ? ? ?DIET:  We do recommend a small  meal at first, but then you may proceed to your regular diet.  Drink plenty of fluids but you should avoid alcoholic beverages for 24 hours. ? ?ACTIVITY:  You should plan to take it easy for the rest of today and you should NOT DRIVE or use heavy machinery until tomorrow (because of the sedation medicines used during the test).   ? ?FOLLOW UP: ?Our staff will call the number listed on your records 48-72 hours following your procedure to check on you and address any questions or concerns that you may have regarding the information given to you following your procedure. If we do not reach you, we will leave a message.  We will attempt to reach you two times.  During this call, we will ask if you have developed any symptoms of COVID 19. If you develop any symptoms (ie: fever, flu-like symptoms, shortness of breath, cough etc.) before then, please call [phone].  If you test positive for Covid 19 in the 2 weeks post procedure, please call and report this information to [place].   ? ?If any biopsies were taken you will be contacted by phone or by letter within the next 1-3 weeks.  Please call us at [phone] if you have not heard about the biopsies in 3 weeks.  ? ? ?SIGNATURES/CONFIDENTIALITY: ?You and/or your care partner have signed paperwork which will be entered into your electronic medical record.  These signatures attest to the fact that that the  information above on your After Visit Summary has been reviewed and is understood.  Full responsibility of the confidentiality of this discharge information lies with you and/or your care-partner. ? ? ? ?

## 2022-05-09 NOTE — Progress Notes (Signed)
? ?  Referring Provider: Horald Pollen, * ?Primary Care Physician:  Horald Pollen, MD ? ?Indication for Procedure:  Colon cancer screening ? ? ?IMPRESSION:  ?Need for colon cancer screening ?Appropriate candidate for monitored anesthesia care ? ?PLAN: ?Colonoscopy in the [place] today ? ? ?HPI: Luis Ward is a 52 y.o. male presents for screening colonoscopy. ? ?No prior colonoscopy or colon cancer screening. ? ?Recent diagnosis of infectious colitis. No ongoing baseline GI symptoms.  ? ?Labs 03/10/22: hemoglobin 12.8, MCV 94.7, RDW 12.1, platelets 286 ? ?No known family history of colon cancer or polyps. No family history of uterine/endometrial cancer, pancreatic cancer or gastric/stomach cancer. ? ? ?Past Medical History:  ?Diagnosis Date  ? ED (erectile dysfunction)   ? Hypertension   ? ? ?Past Surgical History:  ?Procedure Laterality Date  ? NO PAST SURGERIES    ? ? ?Current Outpatient Medications  ?Medication Sig Dispense Refill  ? lisinopril-hydrochlorothiazide (ZESTORETIC) 20-12.5 MG tablet TAKE 2 TABLETS BY MOUTH DAILY 180 tablet 3  ? sildenafil (VIAGRA) 100 MG tablet Take 0.5-1 tablets (50-100 mg total) by mouth daily as needed for erectile dysfunction. 5 tablet 11  ? ?Current Facility-Administered Medications  ?Medication Dose Route Frequency Provider Last Rate Last Admin  ? 0.9 %  sodium chloride infusion  500 mL Intravenous Continuous Thornton Park, MD      ? ? ?Allergies as of 05/09/2022  ? (Not on File)  ? ? ?Family History  ?Problem Relation Age of Onset  ? Heart disease Father   ? Hyperlipidemia Father   ? Hypertension Father   ? Diabetes Father   ? Colon cancer Neg Hx   ? Colon polyps Neg Hx   ? Stomach cancer Neg Hx   ? Esophageal cancer Neg Hx   ? Pancreatic cancer Neg Hx   ? ? ? ?Physical Exam: ?General:   Alert,  well-nourished, pleasant and cooperative in NAD ?Head:  Normocephalic and atraumatic. ?Eyes:  Sclera clear, no icterus.   Conjunctiva pink. ?Mouth:  No deformity or  lesions.   ?Neck:  Supple; no masses or thyromegaly. ?Lungs:  Clear throughout to auscultation.   No wheezes. ?Heart:  Regular rate and rhythm; no murmurs. ?Abdomen:  Soft, non-tender, nondistended, normal bowel sounds, no rebound or guarding.  ?Msk:  Symmetrical. No boney deformities ?LAD: No inguinal or umbilical LAD ?Extremities:  No clubbing or edema. ?Neurologic:  Alert and  oriented x4;  grossly nonfocal ?Skin:  No obvious rash or bruise. ?Psych:  Alert and cooperative. Normal mood and affect. ? ? ? ? ?Studies/Results: ?No results found. ? ? ? ?Rosebud Koenen L. Tarri Glenn, MD, MPH ?05/09/2022, 1:09 PM ? ? ? ?  ?

## 2022-05-11 ENCOUNTER — Telehealth: Payer: Self-pay | Admitting: *Deleted

## 2022-05-11 ENCOUNTER — Telehealth: Payer: Self-pay

## 2022-05-11 NOTE — Telephone Encounter (Signed)
No answer for post procedure followup call. Left VM. ?

## 2022-05-11 NOTE — Telephone Encounter (Signed)
First attempt follow up call to pt, lm for pt to call if having any problems or questions, otherwise we will call them back later this morning or early this afternoon.  °

## 2022-05-14 ENCOUNTER — Encounter: Payer: Self-pay | Admitting: Gastroenterology

## 2022-08-24 ENCOUNTER — Telehealth: Payer: Self-pay | Admitting: *Deleted

## 2022-08-24 DIAGNOSIS — N529 Male erectile dysfunction, unspecified: Secondary | ICD-10-CM

## 2022-08-24 MED ORDER — SILDENAFIL CITRATE 100 MG PO TABS: 50.0000 mg | ORAL_TABLET | Freq: Every day | ORAL | 1 refills | Status: DC | PRN

## 2022-08-24 NOTE — Telephone Encounter (Signed)
New rx sent to requested pharmacy on file

## 2022-12-11 ENCOUNTER — Other Ambulatory Visit: Payer: Self-pay | Admitting: Emergency Medicine

## 2022-12-11 DIAGNOSIS — I10 Essential (primary) hypertension: Secondary | ICD-10-CM

## 2023-02-11 ENCOUNTER — Telehealth: Payer: Commercial Managed Care - PPO | Admitting: Physician Assistant

## 2023-02-11 DIAGNOSIS — R197 Diarrhea, unspecified: Secondary | ICD-10-CM | POA: Diagnosis not present

## 2023-02-11 NOTE — Progress Notes (Signed)
We are sorry that you are not feeling well.  Here is how we plan to help!  Based on what you have shared with me it looks like you have Acute Infectious Diarrhea.  Most cases of acute diarrhea are due to infections with virus and bacteria and are self-limited conditions lasting less than 14 days.  For your symptoms you may take Imodium 2 mg tablets that are over the counter at your local pharmacy. Take two tablet now and then one after each loose stool up to 6 a day.  Antibiotics are not needed for most people with diarrhea.  HOME CARE We recommend changing your diet to help with your symptoms for the next few days. Drink plenty of fluids that contain water salt and sugar. Sports drinks such as Gatorade may help.  You may try broths, soups, bananas, applesauce, soft breads, mashed potatoes or crackers.  You are considered infectious for as long as the diarrhea continues. Hand washing or use of alcohol based hand sanitizers is recommend. It is best to stay out of work or school until your symptoms stop.   GET HELP RIGHT AWAY If you have dark yellow colored urine or do not pass urine frequently you should drink more fluids.   If your symptoms worsen  If you feel like you are going to pass out (faint) You have a new problem  MAKE SURE YOU  Understand these instructions. Will watch your condition. Will get help right away if you are not doing well or get worse.  Thank you for choosing an e-visit.  Your e-visit answers were reviewed by a board certified advanced clinical practitioner to complete your personal care plan. Depending upon the condition, your plan could have included both over the counter or prescription medications.  Please review your pharmacy choice. Make sure the pharmacy is open so you can pick up prescription now. If there is a problem, you may contact your provider through [employer] and have the prescription routed to another pharmacy.  Your safety is important to  [place]. If you have drug allergies check your prescription carefully.   For the next 24 hours you can use MyChart to ask questions about today's visit, request a non-urgent call back, or ask for a work or school excuse. You will get an email in the next two days asking about your experience. I hope that your e-visit has been valuable and will speed your recovery.  I have spent 5 minutes in review of e-visit questionnaire, review and updating patient chart, medical decision making and response to patient.   Mar Daring, PA-C

## 2023-02-17 ENCOUNTER — Other Ambulatory Visit: Payer: Self-pay | Admitting: Emergency Medicine

## 2023-02-17 DIAGNOSIS — I10 Essential (primary) hypertension: Secondary | ICD-10-CM

## 2023-02-25 ENCOUNTER — Encounter: Payer: Self-pay | Admitting: Emergency Medicine

## 2023-02-25 DIAGNOSIS — K529 Noninfective gastroenteritis and colitis, unspecified: Secondary | ICD-10-CM

## 2023-02-26 ENCOUNTER — Ambulatory Visit: Payer: Commercial Managed Care - PPO | Admitting: Emergency Medicine

## 2023-02-26 ENCOUNTER — Encounter: Payer: Self-pay | Admitting: Emergency Medicine

## 2023-02-26 ENCOUNTER — Ambulatory Visit (INDEPENDENT_AMBULATORY_CARE_PROVIDER_SITE_OTHER): Payer: Commercial Managed Care - PPO | Admitting: Emergency Medicine

## 2023-02-26 VITALS — BP 120/82 | HR 95 | Temp 98.4°F | Ht 73.0 in | Wt 194.4 lb

## 2023-02-26 DIAGNOSIS — I10 Essential (primary) hypertension: Secondary | ICD-10-CM | POA: Diagnosis not present

## 2023-02-26 DIAGNOSIS — K529 Noninfective gastroenteritis and colitis, unspecified: Secondary | ICD-10-CM | POA: Diagnosis not present

## 2023-02-26 LAB — COMPREHENSIVE METABOLIC PANEL
ALT: 81 U/L — ABNORMAL HIGH (ref 0–53)
AST: 66 U/L — ABNORMAL HIGH (ref 0–37)
Albumin: 4.5 g/dL (ref 3.5–5.2)
Alkaline Phosphatase: 38 U/L — ABNORMAL LOW (ref 39–117)
BUN: 20 mg/dL (ref 6–23)
CO2: 27 mEq/L (ref 19–32)
Calcium: 10.8 mg/dL — ABNORMAL HIGH (ref 8.4–10.5)
Chloride: 92 mEq/L — ABNORMAL LOW (ref 96–112)
Creatinine, Ser: 1.01 mg/dL (ref 0.40–1.50)
GFR: 86.33 mL/min (ref >60.00)
Glucose, Bld: 73 mg/dL (ref 70–99)
Potassium: 4.2 mEq/L (ref 3.5–5.1)
Sodium: 129 mEq/L — ABNORMAL LOW (ref 135–145)
Total Bilirubin: 0.5 mg/dL (ref 0.2–1.2)
Total Protein: 8.4 g/dL — ABNORMAL HIGH (ref 6.0–8.3)

## 2023-02-26 LAB — CBC WITH DIFFERENTIAL/PLATELET
Basophils Absolute: 0.1 10*3/uL (ref 0.0–0.1)
Basophils Relative: 1 % (ref 0.0–3.0)
Eosinophils Absolute: 0.1 10*3/uL (ref 0.0–0.7)
Eosinophils Relative: 1.2 % (ref 0.0–5.0)
HCT: 38.7 % — ABNORMAL LOW (ref 39.0–52.0)
Hemoglobin: 13.3 g/dL (ref 13.0–17.0)
Lymphocytes Relative: 26.8 % (ref 12.0–46.0)
Lymphs Abs: 1.6 10*3/uL (ref 0.7–4.0)
MCHC: 34.4 g/dL (ref 30.0–36.0)
MCV: 96.7 fl (ref 78.0–100.0)
Monocytes Absolute: 0.6 10*3/uL (ref 0.1–1.0)
Monocytes Relative: 10.5 % (ref 3.0–12.0)
Neutro Abs: 3.6 10*3/uL (ref 1.4–7.7)
Neutrophils Relative %: 60.5 % (ref 43.0–77.0)
Platelets: 287 10*3/uL (ref 150.0–400.0)
RBC: 4 Mil/uL — ABNORMAL LOW (ref 4.22–5.81)
RDW: 12.7 % (ref 11.5–15.5)
WBC: 5.9 10*3/uL (ref 4.0–10.5)

## 2023-02-26 MED ORDER — DICYCLOMINE HCL 20 MG PO TABS: 20.0000 mg | ORAL_TABLET | Freq: Four times a day (QID) | ORAL | 1 refills | Status: AC | PRN

## 2023-02-26 NOTE — Progress Notes (Signed)
Luis Ward 53 y.o.   Chief Complaint  Patient presents with   Acute Visit    Diarrhea x 3 weeks or more, constant, patient started taking imodium but no longer working   Diarrhea more so in the am, not in the afternoon/ evening     HISTORY OF PRESENT ILLNESS: This is a 53 y.o. male complaining of chronic nonbloody diarrhea that started about 2 months ago. Past medical history of hypertension.  No new medications. No new diets or lifestyle changes, Episodes mostly in the morning.  4-5 nonbloody episodes associated with cramping.  No nausea or vomiting. Colonoscopy done in May 2023 which showed diverticulosis and 2 noncancerous polyps. No other associated symptoms No history of EtOH abuse.  Non-smoker. No more stress than usual.  Enjoys his work.  Able to work from home at times.  HPI   Prior to Admission medications   Medication Sig Start Date End Date Taking? Authorizing Provider  lisinopril-hydrochlorothiazide (ZESTORETIC) 20-12.5 MG tablet TAKE 2 TABLETS BY MOUTH DAILY 02/18/23 05/19/23 Yes Barnett Elzey, Ines Bloomer, MD  sildenafil (VIAGRA) 100 MG tablet Take 0.5-1 tablets (50-100 mg total) by mouth daily as needed for erectile dysfunction. 08/24/22  Yes Horald Pollen, MD    No Known Allergies  Patient Active Problem List   Diagnosis Date Noted   Erectile dysfunction 06/08/2021   Dyslipidemia 01/26/2021   Essential hypertension 01/15/2019   Chronic left flank pain 10/02/2018    Past Medical History:  Diagnosis Date   ED (erectile dysfunction)    Hypertension     Past Surgical History:  Procedure Laterality Date   NO PAST SURGERIES      Social History   Socioeconomic History   Marital status: Married    Spouse name: Not on file   Number of children: 4   Years of education: Not on file   Highest education level: Not on file  Occupational History   Occupation: Education officer, museum  Tobacco Use   Smoking status: Former    Types: Cigarettes   Smokeless  tobacco: Never  Scientific laboratory technician Use: Never used  Substance and Sexual Activity   Alcohol use: Yes    Comment: everyday between 3-6 drinks   Drug use: No   Sexual activity: Yes  Other Topics Concern   Not on file  Social History Narrative   Not on file   Social Determinants of Health   Financial Resource Strain: Not on file  Food Insecurity: Not on file  Transportation Needs: Not on file  Physical Activity: Not on file  Stress: Not on file  Social Connections: Not on file  Intimate Partner Violence: Not on file    Family History  Problem Relation Age of Onset   Heart disease Father    Hyperlipidemia Father    Hypertension Father    Diabetes Father    Colon cancer Neg Hx    Colon polyps Neg Hx    Stomach cancer Neg Hx    Esophageal cancer Neg Hx    Pancreatic cancer Neg Hx      Review of Systems  Constitutional: Negative.  Negative for chills and fever.  HENT: Negative.  Negative for congestion and sore throat.   Respiratory: Negative.  Negative for cough and shortness of breath.   Cardiovascular: Negative.  Negative for chest pain and palpitations.  Gastrointestinal:  Positive for abdominal pain and diarrhea. Negative for blood in stool, melena, nausea and vomiting.  Genitourinary: Negative.  Negative for dysuria  and hematuria.  Musculoskeletal: Negative.   Skin: Negative.  Negative for rash.  Neurological: Negative.  Negative for dizziness and headaches.  All other systems reviewed and are negative.   Today's Vitals   02/26/23 1511  BP: 120/82  Pulse: 95  Temp: 98.4 F (36.9 C)  TempSrc: Oral  SpO2: 97%  Weight: 194 lb 6 oz (88.2 kg)  Height: '6\' 1"'$  (1.854 m)   Body mass index is 25.64 kg/m. Wt Readings from Last 3 Encounters:  02/26/23 194 lb 6 oz (88.2 kg)  05/09/22 192 lb (87.1 kg)  03/23/22 192 lb 3.2 oz (87.2 kg)    Physical Exam Vitals reviewed.  Constitutional:      Appearance: Normal appearance.  HENT:     Head: Normocephalic.      Mouth/Throat:     Mouth: Mucous membranes are moist.     Pharynx: Oropharynx is clear.  Eyes:     Extraocular Movements: Extraocular movements intact.     Pupils: Pupils are equal, round, and reactive to light.  Cardiovascular:     Rate and Rhythm: Normal rate and regular rhythm.     Pulses: Normal pulses.     Heart sounds: Normal heart sounds.  Pulmonary:     Effort: Pulmonary effort is normal.     Breath sounds: Normal breath sounds.  Abdominal:     Palpations: Abdomen is soft.     Tenderness: There is no abdominal tenderness.  Musculoskeletal:     Cervical back: No tenderness.  Lymphadenopathy:     Cervical: No cervical adenopathy.  Skin:    General: Skin is warm and dry.     Capillary Refill: Capillary refill takes less than 2 seconds.  Neurological:     General: No focal deficit present.     Mental Status: He is alert and oriented to person, place, and time.  Psychiatric:        Mood and Affect: Mood normal.        Behavior: Behavior normal.      ASSESSMENT & PLAN: A total of 44 minutes was spent with the patient and counseling/coordination of care regarding preparing for this visit, review of most recent office visit notes, review of all medications and diagnosis of hypertension, differential diagnosis of chronic diarrhea and need for GI evaluation, treatment of chronic diarrhea, education on nutrition, need to stay well-hydrated, management of diarrhea, prognosis, documentation, need for follow-up.  Problem List Items Addressed This Visit       Cardiovascular and Mediastinum   Essential hypertension    Well-controlled hypertension Continue Zestoretic 20-12.5 mg daily.        Digestive   Chronic diarrhea - Primary    Clinically stable.  No red flag signs or symptoms Benign abdominal examination Colonoscopy done last year showed diverticulosis Differential diagnosis discussed Suspected small bowel dysfunction Blood work done today including celiac disease  panel Malabsorption/intolerance syndromes considered May benefit from Bentyl if irritable bowel syndrome contributing Needs GI evaluation.  Referral placed today. Considered trial of rifaximin 550 mg 3 times daily but too expensive and not covered by insurance.      Relevant Medications   dicyclomine (BENTYL) 20 MG tablet   Other Relevant Orders   Ambulatory referral to Gastroenterology   CBC with Differential/Platelet   Comprehensive metabolic panel   Thyroid Panel With TSH   Celiac Panel   Patient Instructions  Diarrhea, Adult Diarrhea is when you pass loose and sometimes watery poop (stool) often. Diarrhea can make you feel  weak and cause you to lose water in your body (get dehydrated). Losing water in your body can cause you to: Feel tired and thirsty. Have a dry mouth. Go pee (urinate) less often. Diarrhea often lasts 2-3 days. It can last longer if it is a sign of something more serious. Be sure to treat your diarrhea as told by your doctor. Follow these instructions at home: Eating and drinking     Follow these instructions as told by your doctor: Take an ORS (oral rehydration solution). This is a drink that helps you replace fluids and minerals your body lost. It is sold at pharmacies and stores. Drink enough fluid to keep your pee (urine) pale yellow. Drink fluids such as: Water. You can also get fluids by sucking on ice chips. Diluted fruit juice. Low-calorie sports drinks. Milk. Avoid drinking fluids that have a lot of sugar or caffeine in them. These include soda, energy drinks, and regular sports drinks. Avoid alcohol. Eat bland, easy-to-digest foods in small amounts as you are able. These foods include: Bananas. Applesauce. Rice. Low-fat (lean) meats. Toast. Crackers. Avoid spicy or fatty foods.  Medicines Take over-the-counter and prescription medicines only as told by your doctor. If you were prescribed antibiotics, take them as told by your doctor. Do  not stop taking them even if you start to feel better. General instructions  Wash your hands often using soap and water for 20 seconds. If soap and water are not available, use hand sanitizer. Others in your home should wash their hands as well. Wash your hands: After using the toilet or changing a diaper. Before preparing, cooking, or serving food. While caring for a sick person. While visiting someone in a hospital. Rest at home while you get better. Take a warm bath to help with any burning or pain from having diarrhea. Watch your condition for any changes. Contact a doctor if: You have a fever. Your diarrhea gets worse. You have new symptoms. You vomit every time you eat or drink. You feel light-headed, dizzy, or you have a headache. You have muscle cramps. You have signs of losing too much water in your body, such as: Dark pee, very little pee, or no pee. Cracked lips. Dry mouth. Sunken eyes. Sleepiness. Weakness. You have bloody or black poop or poop that looks like tar. You have very bad pain, cramping, or bloating in your belly (abdomen). Your skin feels cold and clammy. You feel confused. Get help right away if: You have chest pain. Your heart is beating very quickly. You have trouble breathing or you are breathing very quickly. You feel very weak or you faint. These symptoms may be an emergency. Get help right away. Call 911. Do not wait to see if the symptoms will go away. Do not drive yourself to the hospital. This information is not intended to replace advice given to you by your health care provider. Make sure you discuss any questions you have with your health care provider. Document Revised: 06/05/2022 Document Reviewed: 06/05/2022 Elsevier Patient Education  [place], MD [facility]

## 2023-02-26 NOTE — Assessment & Plan Note (Signed)
Well-controlled hypertension Continue Zestoretic 20-12.5 mg daily.

## 2023-02-26 NOTE — Assessment & Plan Note (Signed)
Clinically stable.  No red flag signs or symptoms Benign abdominal examination Colonoscopy done last year showed diverticulosis Differential diagnosis discussed Suspected small bowel dysfunction Blood work done today including celiac disease panel Malabsorption/intolerance syndromes considered May benefit from Bentyl if irritable bowel syndrome contributing Needs GI evaluation.  Referral placed today. Considered trial of rifaximin 550 mg 3 times daily but too expensive and not covered by insurance.

## 2023-02-26 NOTE — Patient Instructions (Signed)
Diarrhea, Adult Diarrhea is when you pass loose and sometimes watery poop (stool) often. Diarrhea can make you feel weak and cause you to lose water in your body (get dehydrated). Losing water in your body can cause you to: Feel tired and thirsty. Have a dry mouth. Go pee (urinate) less often. Diarrhea often lasts 2-3 days. It can last longer if it is a sign of something more serious. Be sure to treat your diarrhea as told by your doctor. Follow these instructions at home: Eating and drinking     Follow these instructions as told by your doctor: Take an ORS (oral rehydration solution). This is a drink that helps you replace fluids and minerals your body lost. It is sold at pharmacies and stores. Drink enough fluid to keep your pee (urine) pale yellow. Drink fluids such as: Water. You can also get fluids by sucking on ice chips. Diluted fruit juice. Low-calorie sports drinks. Milk. Avoid drinking fluids that have a lot of sugar or caffeine in them. These include soda, energy drinks, and regular sports drinks. Avoid alcohol. Eat bland, easy-to-digest foods in small amounts as you are able. These foods include: Bananas. Applesauce. Rice. Low-fat (lean) meats. Toast. Crackers. Avoid spicy or fatty foods.  Medicines Take over-the-counter and prescription medicines only as told by your doctor. If you were prescribed antibiotics, take them as told by your doctor. Do not stop taking them even if you start to feel better. General instructions  Wash your hands often using soap and water for 20 seconds. If soap and water are not available, use hand sanitizer. Others in your home should wash their hands as well. Wash your hands: After using the toilet or changing a diaper. Before preparing, cooking, or serving food. While caring for a sick person. While visiting someone in a hospital. Rest at home while you get better. Take a warm bath to help with any burning or pain from having  diarrhea. Watch your condition for any changes. Contact a doctor if: You have a fever. Your diarrhea gets worse. You have new symptoms. You vomit every time you eat or drink. You feel light-headed, dizzy, or you have a headache. You have muscle cramps. You have signs of losing too much water in your body, such as: Dark pee, very little pee, or no pee. Cracked lips. Dry mouth. Sunken eyes. Sleepiness. Weakness. You have bloody or black poop or poop that looks like tar. You have very bad pain, cramping, or bloating in your belly (abdomen). Your skin feels cold and clammy. You feel confused. Get help right away if: You have chest pain. Your heart is beating very quickly. You have trouble breathing or you are breathing very quickly. You feel very weak or you faint. These symptoms may be an emergency. Get help right away. Call 911. Do not wait to see if the symptoms will go away. Do not drive yourself to the hospital. This information is not intended to replace advice given to you by your health care provider. Make sure you discuss any questions you have with your health care provider. Document Revised: 06/05/2022 Document Reviewed: 06/05/2022 Elsevier Patient Education  [place].

## 2023-02-27 ENCOUNTER — Other Ambulatory Visit: Payer: Self-pay | Admitting: Emergency Medicine

## 2023-02-27 DIAGNOSIS — I10 Essential (primary) hypertension: Secondary | ICD-10-CM

## 2023-02-27 LAB — THYROID PANEL WITH TSH
Free Thyroxine Index: 1.9 (ref 1.4–3.8)
T3 Uptake: 30 % (ref 22–35)
T4, Total: 6.3 ug/dL (ref 4.9–10.5)
TSH: 1.17 mIU/L (ref 0.40–4.50)

## 2023-02-27 MED ORDER — VALSARTAN 160 MG PO TABS: 160.0000 mg | ORAL_TABLET | Freq: Every day | ORAL | 3 refills | Status: DC

## 2023-02-28 LAB — GLIA (IGA/G) + TTG IGA
Antigliadin Abs, IgA: 8 units (ref 0–19)
Gliadin IgG: 3 units (ref 0–19)
Transglutaminase IgA: <2 U/mL (ref 0–3)

## 2023-04-04 ENCOUNTER — Encounter: Payer: Self-pay | Admitting: Nurse Practitioner

## 2023-04-04 ENCOUNTER — Ambulatory Visit (INDEPENDENT_AMBULATORY_CARE_PROVIDER_SITE_OTHER): Payer: Commercial Managed Care - PPO | Admitting: Nurse Practitioner

## 2023-04-04 ENCOUNTER — Ambulatory Visit: Payer: Commercial Managed Care - PPO | Admitting: Nurse Practitioner

## 2023-04-04 ENCOUNTER — Other Ambulatory Visit: Payer: Commercial Managed Care - PPO

## 2023-04-04 VITALS — BP 156/86 | HR 86 | Ht 73.0 in | Wt 196.0 lb

## 2023-04-04 DIAGNOSIS — R194 Change in bowel habit: Secondary | ICD-10-CM

## 2023-04-04 NOTE — Patient Instructions (Addendum)
Your provider has requested that you go to the basement level for lab work before leaving today. Press "B" on the elevator. The lab is located at the first door on the left as you exit the elevator.  _______________________________________________________  If your blood pressure at your visit was 140/90 or greater, please contact your primary care physician to follow up on this.  _______________________________________________________  If you are age 51 or older, your body mass index should be between 23-30. Your Body mass index is 25.86 kg/m. If this is out of the aforementioned range listed, please consider follow up with your Primary Care Provider.  If you are age 81 or younger, your body mass index should be between 19-25. Your Body mass index is 25.86 kg/m. If this is out of the aformentioned range listed, please consider follow up with your Primary Care Provider.   ________________________________________________________  The [facility] providers would like to encourage you to use [facility] to communicate with providers for non-urgent requests or questions.  Due to long hold times on the telephone, sending your provider a message by [facility] may be a faster and more efficient way to get a response.  Please allow 48 business hours for a response.  Please remember that this is for non-urgent requests.  _______________________________________________________ It was a pleasure to see you today!  Thank you for trusting me with your gastrointestinal care!

## 2023-04-04 NOTE — Progress Notes (Signed)
Assessment   53 y.o. yo male with the following:   Several week history of unexplained bowel changes which started with urgent diarrhea .  Infectious process? Post-infectious IBS? SIBO?  Having increased frequency of BMs with urgency and stool consistency changes.  Stools are sometimes formed but more often loose, sometimes "fluffy" and possibly contain mucous.  IBD seems doubtful.  He had an unrevealing  colonoscopy less than a year ago for evaluation of colitis on CT scan, abdominal pain and diarrhea with blood. His symptoms totally resolved with antibiotics at that time.   He is non-toxic appearing.   Plan  Stool for C. difficile, Giardia antigen, lactoferrin Advised to avoid diary for now Suggested her reduce caffeine consumption for now   History of Present Illness   Chief complaint:  urgent stools. Stool consistency changes.     Luis Ward is a 53 y.o. male known to Dr. Tarri Glenn with a past medical history of colon polyps. See PMH / [place] for additional details.   Luis Ward was seen here in March 2023 for bloody diarrhea and colitis on CT scan. Treated with antibiotics with resolution of symptoms .  He subsequently underwent a colonoscopy which did not show any signs of colitis.  He had a couple of small polyps removed, one was a tubular adenoma.   Interval History:  In January Luis Ward developed recurrent diarrhea.  This time it hasn't associated with any bleeding nor any abdominal pain.  He sometimes has perianal discomfort from wiping but no rectal pain. Saw PCP 2/27,   given Bentyl which eventually helped but didn't resolve the diarrhea.   He stopped taking the Bentyl several days ago. He is still having unpredictalle bowel movements. Stools are often urgent and consistently varies between formed and loose.  Sometimes appear to contain mucous.  Stools do not appear oily . He can have up to 5 BMs a day.  The unpredictability and urgency can make it difficult to leave his home. No  associated N/V. No fevers.  Bowel changes were not preceded by any dietary changes or medication changes. He had not had any antibiotics in the preceding months. His weight is stable.    Labs:     Latest Ref Rng & Units 02/26/2023    4:09 PM 03/10/2022   11:11 AM 06/08/2021   10:58 AM  Hepatic Function  Total Protein 6.0 - 8.3 g/dL 8.4  8.3  7.9   Albumin 3.5 - 5.2 g/dL 4.5  4.4  4.1   AST 0 - 37 U/L 66  41  47   ALT 0 - 53 U/L 81  55  54   Alk Phosphatase 39 - 117 U/L 38  32  36   Total Bilirubin 0.2 - 1.2 mg/dL 0.5  0.5  0.2        Latest Ref Rng & Units 02/26/2023    4:09 PM 03/10/2022   11:11 AM 06/08/2021   10:58 AM  CBC  WBC 4.0 - 10.5 K/uL 5.9  12.5  4.9   Hemoglobin 13.0 - 17.0 g/dL 13.3  12.8  12.3   Hematocrit 39.0 - 52.0 % 38.7  37.5  36.3   Platelets 150.0 - 400.0 K/uL 287.0  286  309.0      Previous GI Evaluation   May 2023 colonoscopy - Diverticulosis in the sigmoid colon, in the descending colon and in the ascending colon. - One 2 mm polyp in the rectum, removed with a cold snare.  Resected and retrieved. - One 3 mm polyp in the proximal ascending colon.   Surgical [P], colon, ascending, polyp (1) TUBULAR ADENOMA NEGATIVE FOR HIGH-GRADE DYSPLASIA AND CARCINOMA 2. Surgical [P], colon, rectum, polyp (1) HYPERPLASTIC POLYP NEGATIVE FOR DYSPLASIA AND CARCINOMA   Past Medical History:  Diagnosis Date   ED (erectile dysfunction)    Hypertension     Past Surgical History:  Procedure Laterality Date   NO PAST SURGERIES      Current Medications, Allergies, Family History and Social History were reviewed in [employer] record.     Current Outpatient Medications  Medication Sig Dispense Refill   dicyclomine (BENTYL) 20 MG tablet Take 1 tablet (20 mg total) by mouth every 6 (six) hours as needed for spasms. 20 tablet 1   sildenafil (VIAGRA) 100 MG tablet Take 0.5-1 tablets (50-100 mg total) by mouth daily as needed for erectile  dysfunction. 5 tablet 1   valsartan (DIOVAN) 160 MG tablet Take 1 tablet (160 mg total) by mouth daily. 90 tablet 3   No current facility-administered medications for this visit.    Review of Systems: No chest pain. No shortness of breath. No urinary complaints.    Physical Exam  Wt Readings from Last 3 Encounters:  04/04/23 196 lb (88.9 kg)  02/26/23 194 lb 6 oz (88.2 kg)  05/09/22 192 lb (87.1 kg)    BP (!) 160/90   Pulse 86   Ht 6\' 1"  (1.854 m)   Wt 196 lb (88.9 kg)   BMI 25.86 kg/m  Constitutional:  Pleasant, generally well appearing male in no acute distress. Psychiatric: Normal mood and affect. Behavior is normal. EENT: Pupils normal.  Conjunctivae are normal. No scleral icterus. Neck supple.  Cardiovascular: Normal rate, regular rhythm.  Pulmonary/chest: Effort normal and breath sounds normal. No wheezing, rales or rhonchi. Abdominal: Soft, nondistended, nontender. Bowel sounds active throughout. There are no masses palpable. No hepatomegaly. Neurological: Alert and oriented to person place and time.  Skin: Skin is warm and dry. No rashes noted.  Luis Savoy, NP  04/04/2023, 3:25 PM  Cc:  Horald Pollen, *

## 2023-04-10 ENCOUNTER — Other Ambulatory Visit: Payer: Commercial Managed Care - PPO

## 2023-04-10 DIAGNOSIS — R194 Change in bowel habit: Secondary | ICD-10-CM

## 2023-04-10 NOTE — Progress Notes (Signed)
Reviewed and agree with management plans. Could consider empiric trial of antibiotics for SIBO if symptoms not improving with recommended changes.   Daton Szilagyi L. Orvan Falconer, MD, MPH

## 2023-04-11 LAB — GIARDIA ANTIGEN
MICRO NUMBER:: 14806479
RESULT:: NOT DETECTED
SPECIMEN QUALITY:: ADEQUATE

## 2023-04-11 LAB — FECAL LACTOFERRIN, QUANT
Fecal Lactoferrin: NEGATIVE
MICRO NUMBER:: 14806723
SPECIMEN QUALITY:: ADEQUATE

## 2023-04-12 LAB — CLOSTRIDIUM DIFFICILE BY PCR: Toxigenic C. Difficile by PCR: NEGATIVE

## 2023-04-17 ENCOUNTER — Other Ambulatory Visit: Payer: Self-pay

## 2023-04-17 ENCOUNTER — Telehealth: Payer: Self-pay | Admitting: Nurse Practitioner

## 2023-04-17 MED ORDER — RIFAXIMIN 550 MG PO TABS
550.0000 mg | ORAL_TABLET | Freq: Three times a day (TID) | ORAL | 0 refills | Status: AC
Start: 2023-04-17 — End: 2023-05-01

## 2023-04-17 NOTE — Telephone Encounter (Signed)
PT is calling to proceed with the empiric trial of antibiotics for SIBO. RX should be sent to [facility] on [address]

## 2023-04-17 NOTE — Telephone Encounter (Signed)
Spoke with the patient. Advised the Xifaxan 550 mg to take TID x 14 days has been sent to his pharmacy. See the labs results of 04/10/23 for orders. Questions about treatment invited and answered. Guided the patient to medlineplus.gov to self educate on SIBO and diet. No further questions at the end of the call.

## 2023-04-18 ENCOUNTER — Telehealth: Payer: Self-pay

## 2023-04-18 NOTE — Telephone Encounter (Signed)
Xifaxan prescribed for the patient. Patient calls to let us know his pharmacy said the medication requires prior authorization.  Thank you for your assistance. Dx IBS-D and SIBO

## 2023-04-18 NOTE — Telephone Encounter (Signed)
Patient is calling states he has further questions regarding the conversation that was had yesterday and is wishing to speak with Beth. Please advise

## 2023-04-18 NOTE — Telephone Encounter (Signed)
Spoke with the patient. He has been informed by his pharmacist the Xifaxan will require PA. Routed a new encounter to notify the pharmacy team of this information.

## 2023-04-22 ENCOUNTER — Telehealth: Payer: Self-pay

## 2023-04-22 NOTE — Telephone Encounter (Signed)
PA has been APPROVED from 04/22/2023-07/22/2023

## 2023-04-22 NOTE — Telephone Encounter (Signed)
PA request received via CMM for Xifaxan  tablets  PA has been submitted to [facility] and is pending determination  Key: [id number]

## 2023-04-26 ENCOUNTER — Other Ambulatory Visit: Payer: Self-pay | Admitting: Emergency Medicine

## 2023-04-26 ENCOUNTER — Telehealth: Payer: Self-pay

## 2023-04-26 DIAGNOSIS — I10 Essential (primary) hypertension: Secondary | ICD-10-CM

## 2023-04-26 NOTE — Telephone Encounter (Signed)
Patient called stating when he went to go pick up medication the pharmacy told him that they were still waiting to hear from [facility] regarding prior auth being approved. Patient is requesting a call back when [facility] is [facility] [address] on [address]. Please advise, thank you.

## 2023-04-26 NOTE — Telephone Encounter (Signed)
Patient's co-pay for Xifaxan will be >$1,000.00 He is unable to afford this. What are the alternatives?

## 2023-05-02 MED ORDER — SULFAMETHOXAZOLE-TRIMETHOPRIM 800-160 MG PO TABS: 1.0000 | ORAL_TABLET | Freq: Two times a day (BID) | ORAL | 0 refills | Status: AC

## 2023-05-02 NOTE — Telephone Encounter (Signed)
See alternate pt has been sent alternative

## 2023-05-02 NOTE — Telephone Encounter (Signed)
Can try trimethoprim-sulfamethoxazole 160/800 BID x 10 days. Thanks

## 2023-05-02 NOTE — Telephone Encounter (Signed)
Spoke to patient. Medication sent to pharmacy. Patient aware. Patient will call back if medication is too expensive.

## 2023-05-23 ENCOUNTER — Other Ambulatory Visit: Payer: Self-pay | Admitting: Emergency Medicine

## 2023-05-23 ENCOUNTER — Other Ambulatory Visit: Payer: Self-pay | Admitting: *Deleted

## 2023-05-23 DIAGNOSIS — I10 Essential (primary) hypertension: Secondary | ICD-10-CM

## 2023-05-23 MED ORDER — VALSARTAN 160 MG PO TABS: 160.0000 mg | ORAL_TABLET | Freq: Every day | ORAL | 3 refills | Status: DC

## 2023-05-29 IMAGING — CT CT ABD-PELV W/ CM
2 of 5 series · 16 of 46 positions shown, 18 images · IV contrast (APPLIED)
Comparison: None

CLINICAL DATA: 52-year-old male with acute abdominal pelvic pain
and diarrhea.

EXAM:
CT ABDOMEN AND PELVIS WITH CONTRAST
TECHNIQUE: Multidetector CT imaging of the abdomen and pelvis was performed
using the standard protocol following bolus administration of
intravenous contrast.

[Series 2: abd pel w · axial · 0.83mm/px · z∈[+698,+1098]mm · 13 of 92 slices shown, 15 images]
[im 6/92  soft-tissue]
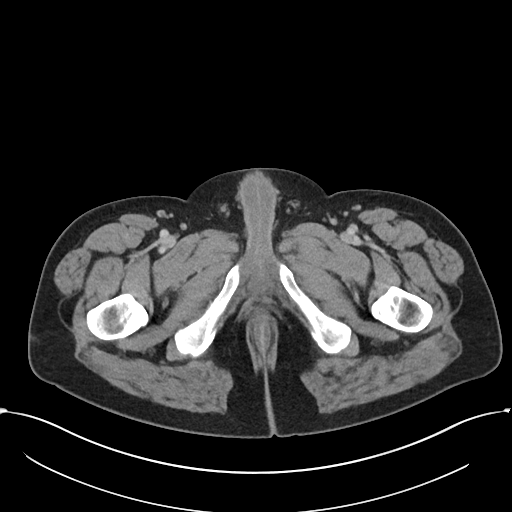
[im 6/92  bone]
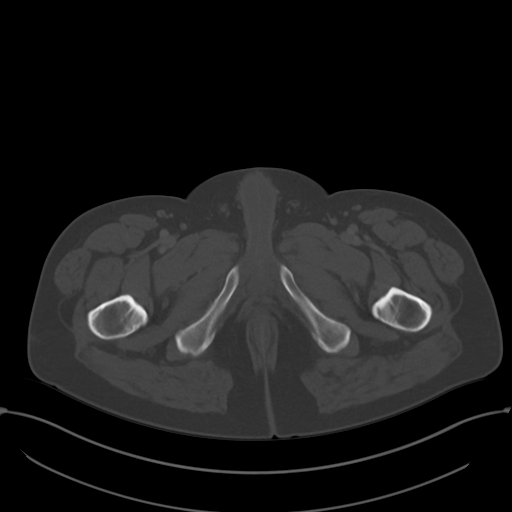
[im 11/92  soft-tissue]
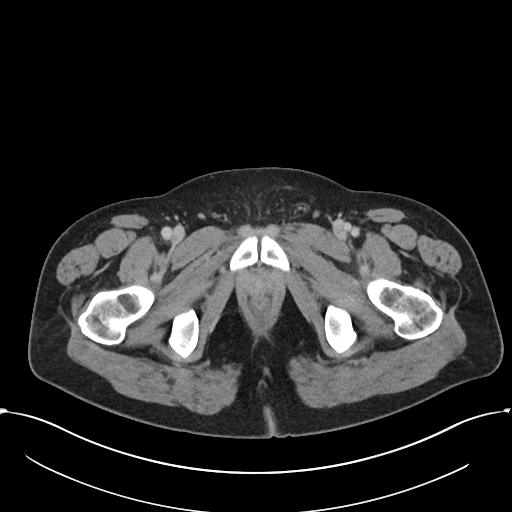
[im 21/92  soft-tissue]
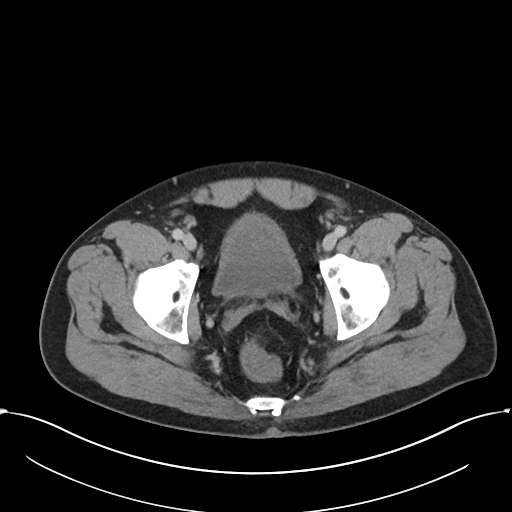
[im 26/92  soft-tissue]
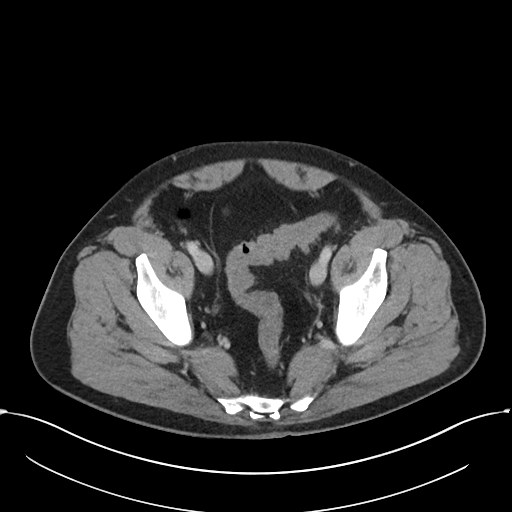
[im 31/92  soft-tissue]
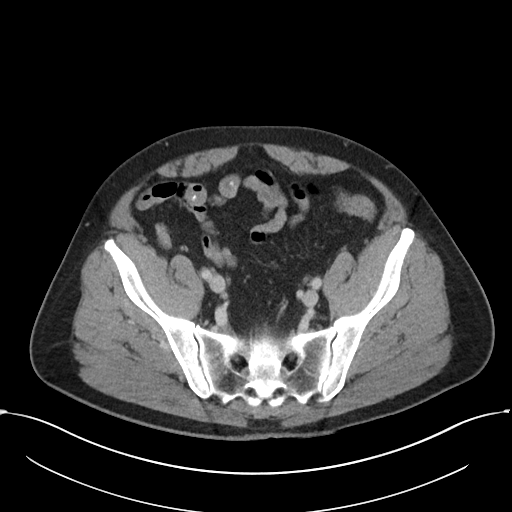
[im 41/92  soft-tissue]
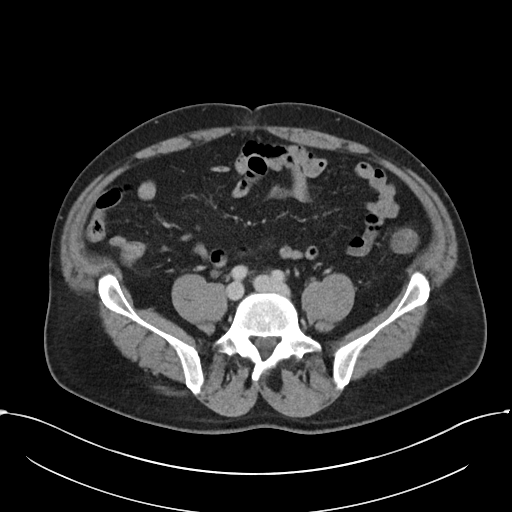
[im 46/92  soft-tissue]
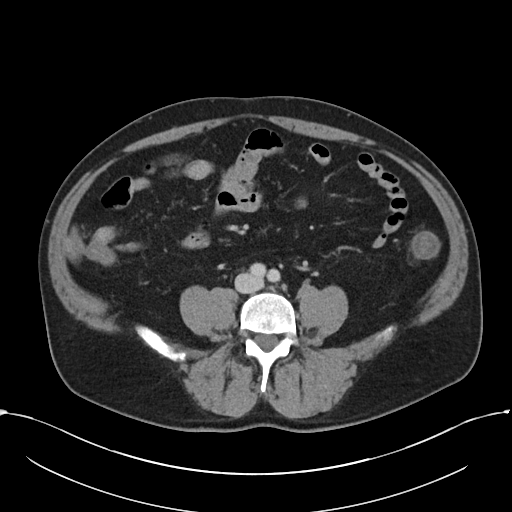
[im 51/92  soft-tissue]
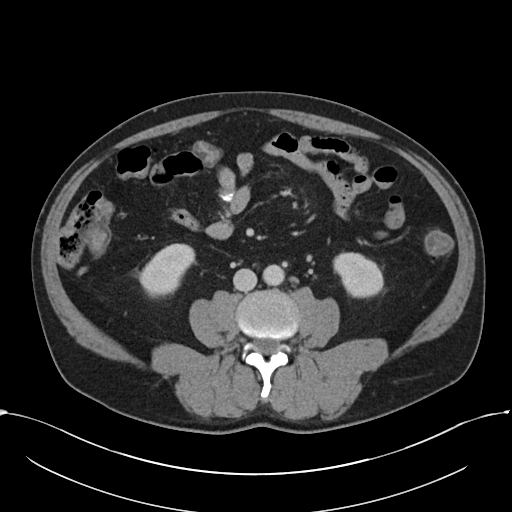
[im 61/92  soft-tissue]
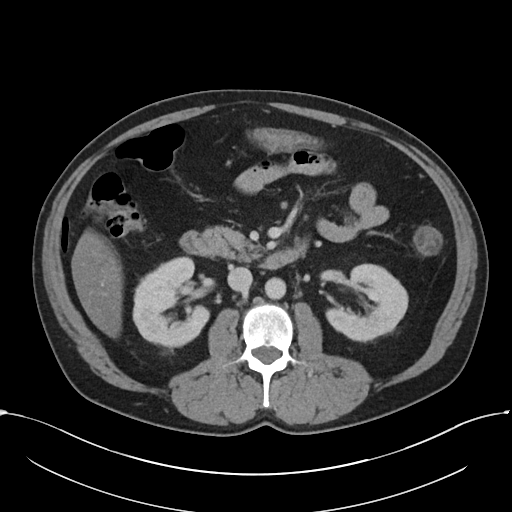
[im 61/92  bone]
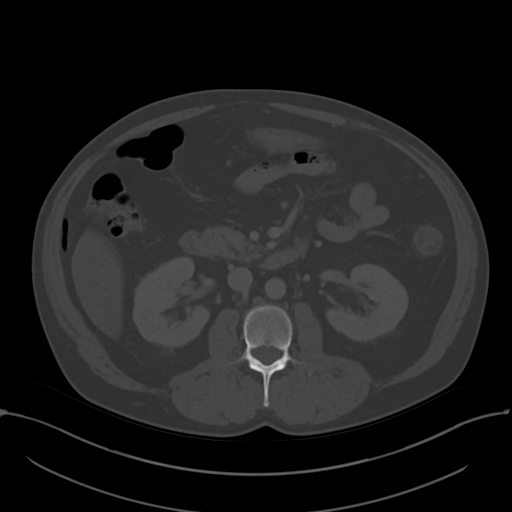
[im 66/92  soft-tissue]
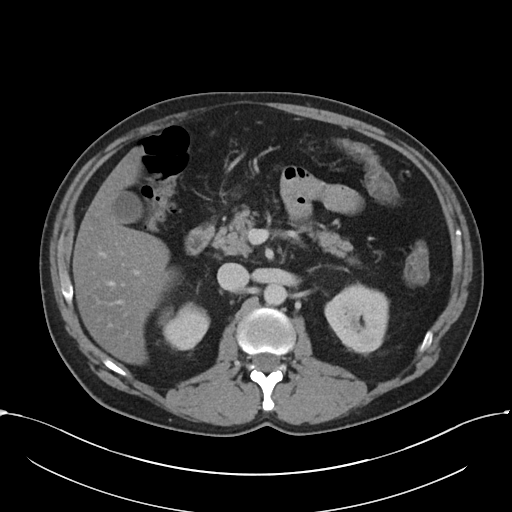
[im 71/92  soft-tissue]
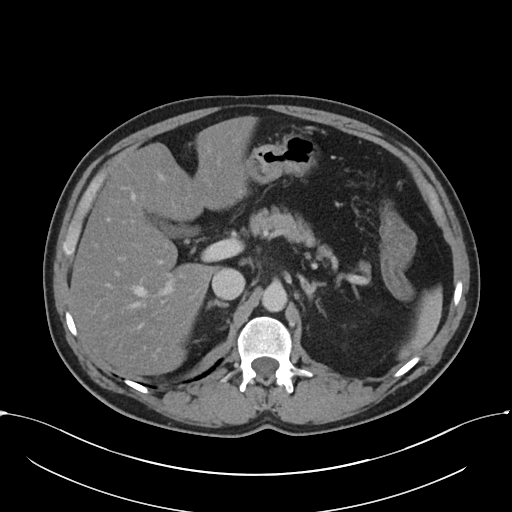
[im 81/92  soft-tissue]
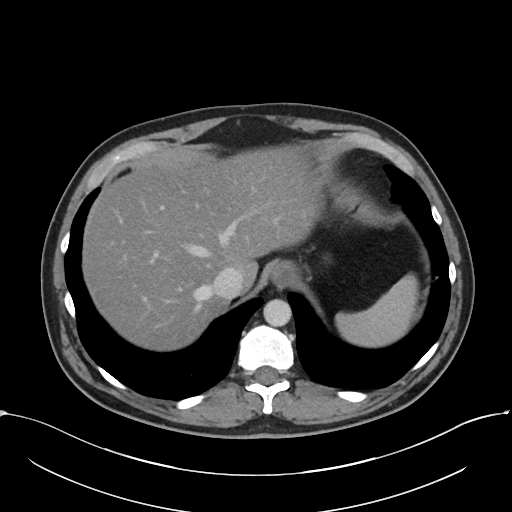
[im 86/92  soft-tissue]
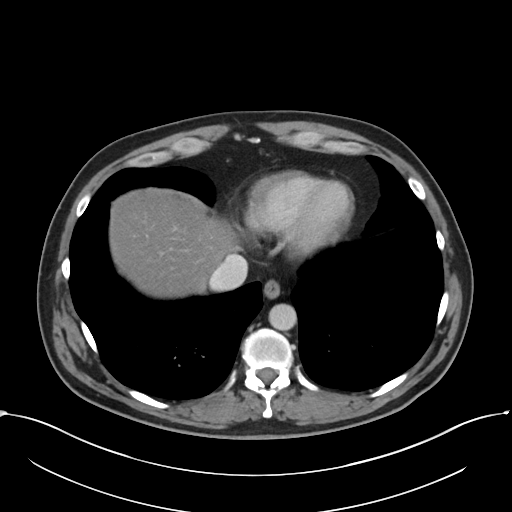

[Series 5: coronal · coronal · 0.83mm/px · 3 of 99 slices shown]
[im 33/99  soft-tissue]
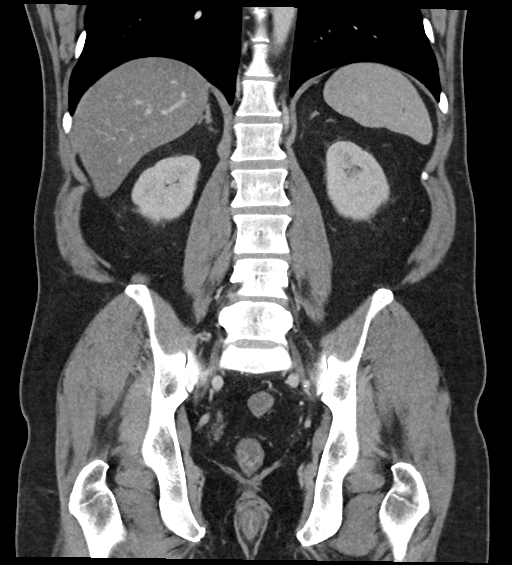
[im 44/99  soft-tissue]
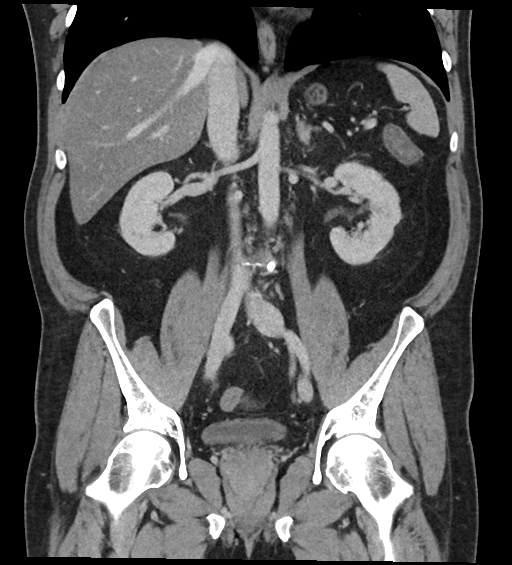
[im 55/99  soft-tissue]
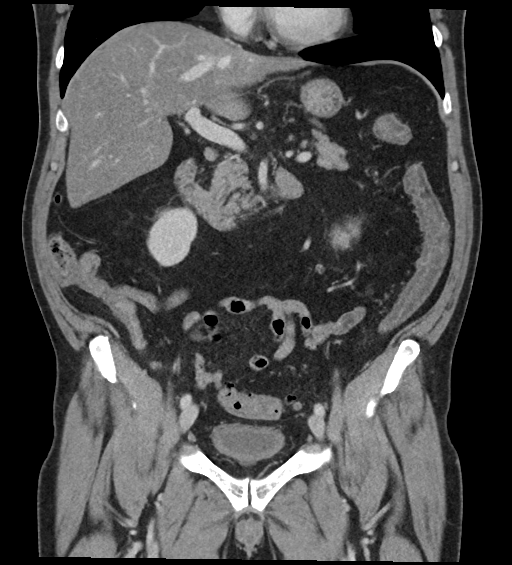

[16 of 46 positions shown; findings below may reference images not displayed]

RADIATION DOSE REDUCTION: This exam was performed according to the
departmental dose-optimization program which includes automated
exposure control, adjustment of the mA and/or kV according to
patient size and/or use of iterative reconstruction technique.

CONTRAST:  100mL OMNIPAQUE IOHEXOL 300 MG/ML  SOLN
FINDINGS: Lower chest: No acute abnormality

Hepatobiliary: Hepatic steatosis identified without focal hepatic
abnormality. The gallbladder is unremarkable. There is no evidence
of intrahepatic or extrahepatic biliary dilatation.

Pancreas: Unremarkable

Spleen: Unremarkable

Adrenals/Urinary Tract: The kidneys, adrenal glands and bladder are
unremarkable.

Stomach/Bowel: Circumferential wall thickening of the transverse and
descending colon noted with mild adjacent inflammation, compatible
with colitis. There is no evidence of bowel obstruction or
pneumoperitoneum. A few scattered colonic diverticula are noted. The
mesenteric arteries and veins appear patent.

No other bowel wall thickening or inflammation noted. The appendix
is normal.

Vascular/Lymphatic: Aortic atherosclerosis. No enlarged abdominal or
pelvic lymph nodes.

Reproductive: Prostate is unremarkable.

Other: No ascites, focal collection or pneumoperitoneum.

Musculoskeletal: No acute or suspicious bony abnormalities are
noted.
IMPRESSION: 1. Colitis involving the transverse and descending colon. No
evidence of bowel obstruction, pneumoperitoneum or abscess.
2. Hepatic steatosis.
3. Aortic Atherosclerosis ([id number]).

## 2023-07-05 ENCOUNTER — Telehealth: Payer: Self-pay | Admitting: Emergency Medicine

## 2023-07-05 NOTE — Telephone Encounter (Signed)
Prescription Request  07/05/2023  LOV: 02/26/2023  What is the name of the medication or equipment? sildenafil (VIAGRA) 100 MG tablet   Have you contacted your pharmacy to request a refill? No   Which pharmacy would you like this sent to?   [id number] - [place], [place] - [address] AT CORNER OF [facility] [address] [place] [address] Phone: [phone] Fax: [phone]  Patient notified that their request is being sent to the clinical staff for review and that they should receive a response within 2 business days.   Please advise at Mobile [phone] (mobile)

## 2023-07-08 ENCOUNTER — Other Ambulatory Visit: Payer: Self-pay | Admitting: *Deleted

## 2023-07-08 DIAGNOSIS — N529 Male erectile dysfunction, unspecified: Secondary | ICD-10-CM

## 2023-07-08 MED ORDER — SILDENAFIL CITRATE 100 MG PO TABS: 50.0000 mg | ORAL_TABLET | Freq: Every day | ORAL | 1 refills | Status: DC | PRN

## 2023-07-08 NOTE — Telephone Encounter (Signed)
New prescription sent to patient pharmacy.

## 2024-01-25 ENCOUNTER — Other Ambulatory Visit: Payer: Self-pay | Admitting: Emergency Medicine

## 2024-01-25 DIAGNOSIS — N529 Male erectile dysfunction, unspecified: Secondary | ICD-10-CM

## 2024-02-27 ENCOUNTER — Ambulatory Visit (INDEPENDENT_AMBULATORY_CARE_PROVIDER_SITE_OTHER): Payer: PRIVATE HEALTH INSURANCE | Admitting: Internal Medicine

## 2024-02-27 ENCOUNTER — Encounter: Payer: Self-pay | Admitting: Internal Medicine

## 2024-02-27 ENCOUNTER — Ambulatory Visit: Payer: Self-pay | Admitting: Emergency Medicine

## 2024-02-27 VITALS — BP 160/110 | Temp 98.2°F | Ht 73.0 in | Wt 197.2 lb

## 2024-02-27 DIAGNOSIS — I10 Essential (primary) hypertension: Secondary | ICD-10-CM

## 2024-02-27 DIAGNOSIS — R945 Abnormal results of liver function studies: Secondary | ICD-10-CM

## 2024-02-27 MED ORDER — AMLODIPINE BESYLATE 2.5 MG PO TABS: 2.5000 mg | ORAL_TABLET | Freq: Every day | ORAL | 0 refills | Status: DC

## 2024-02-27 MED ORDER — AMLODIPINE BESYLATE 2.5 MG PO TABS: 2.5000 mg | ORAL_TABLET | Freq: Every day | ORAL | 11 refills | Status: DC

## 2024-02-27 NOTE — Assessment & Plan Note (Addendum)
 Chronic, uncontrolled.  He admits non-compliance with meds. Most recent labs reviewed, 01/2023, nl renal function.  He was given information on the DASH diet.  Due to the marked elevation of bp, I do not think increasing dose of valsartan is ideal.  He will continue with taking valsartan 160mg  in AM.   I will start him on amlodipine 2.5mg  to be taken nightly, advised to f/u with PCP in 2 weeks for f/u.  Pt is aware dose will likely need to be titrated upward.  I also contacted PCP via Securechat and updated him on treatment plan. Patient is aware the dose will likely be titrated. Importance of salt restriction was also discussed with the patient. All questions were answered to his satisfaction.  He agrees to [employer] as below, results will be forwarded to patient in Mychart and to his PCP.   Of note, he does admit to daily alcohol use, this likely contributes to his BP elevation.

## 2024-02-27 NOTE — Telephone Encounter (Signed)
  Chief Complaint: Elevated BP Symptoms: None Frequency: Ongoing about 1 week Pertinent Negatives: Patient denies CP, SOB, vision changes Disposition: [] ED /[] Urgent Care (no appt availability in office) / [x] Appointment(In office/virtual)/ []  [place] [facility]/ [] Home Care/ [] Refused Recommended Disposition /[]  Mobile Bus/ []  Follow-up with PCP Additional Notes: Pt reports he has been having elevated BP readings for about 1 week. He notes he was unable to be treated at the dentist last week due to his BP. His BP today at the eye doctor was 170/110. Pt denies SOB, CP, vision changes. No symptoms noted. OV scheduled today. This RN educated pt on home care, new-worsening symptoms, when to call back/seek emergent care. Pt verbalized understanding and agrees to plan.     Copied from CRM [id number]. Topic: Clinical - Red Word Triage >> Feb 27, 2024 12:19 PM Melissa C wrote: [facility] that prompted transfer to Nurse Triage: patient's blood pressure has been extremely high for the past week and a half since he tried to go to the dentist. Has been taking meds since then to try to lower it but it has not worked. Reason for Disposition  [1] Systolic BP  >= 200 OR Diastolic >= 120 AND [2] having NO cardiac or neurologic symptoms  Answer Assessment - Initial Assessment Questions 1. BLOOD PRESSURE: "What is the blood pressure?" "Did you take at least two measurements 5 minutes apart?"     170/110 2. ONSET: "When did you take your blood pressure?"     An hour ago at eye doctor 3. HOW: "How did you take your blood pressure?" (e.g., automatic home BP monitor, visiting nurse)     Wrist Cuff 4. HISTORY: "Do you have a history of high blood pressure?"     Yes 5. MEDICINES: "Are you taking any medicines for blood pressure?" "Have you missed any doses recently?"     Valsartan 160 mg once daily 6. OTHER SYMPTOMS: "Do you have any symptoms?" (e.g., blurred vision, chest pain, difficulty breathing,  headache, weakness)     None  Protocols used: Blood Pressure - High-A-AH

## 2024-02-27 NOTE — Patient Instructions (Signed)
 Hypertension, Adult Hypertension is another name for high blood pressure. High blood pressure forces your heart to work harder to pump blood. This can cause problems over time. There are two numbers in a blood pressure reading. There is a top number (systolic) over a bottom number (diastolic). It is best to have a blood pressure that is below 120/80. What are the causes? The cause of this condition is not known. Some other conditions can lead to high blood pressure. What increases the risk? Some lifestyle factors can make you more likely to develop high blood pressure: Smoking. Not getting enough exercise or physical activity. Being overweight. Having too much fat, sugar, calories, or salt (sodium) in your diet. Drinking too much alcohol. Other risk factors include: Having any of these conditions: Heart disease. Diabetes. High cholesterol. Kidney disease. Obstructive sleep apnea. Having a family history of high blood pressure and high cholesterol. Age. The risk increases with age. Stress. What are the signs or symptoms? High blood pressure may not cause symptoms. Very high blood pressure (hypertensive crisis) may cause: Headache. Fast or uneven heartbeats (palpitations). Shortness of breath. Nosebleed. Vomiting or feeling like you may vomit (nauseous). Changes in how you see. Very bad chest pain. Feeling dizzy. Seizures. How is this treated? This condition is treated by making healthy lifestyle changes, such as: Eating healthy foods. Exercising more. Drinking less alcohol. Your doctor may prescribe medicine if lifestyle changes do not help enough and if: Your top number is above 130. Your bottom number is above 80. Your personal target blood pressure may vary. Follow these instructions at home: Eating and drinking  If told, follow the DASH eating plan. To follow this plan: Fill one half of your plate at each meal with fruits and vegetables. Fill one fourth of your plate  at each meal with whole grains. Whole grains include whole-wheat pasta, brown rice, and whole-grain bread. Eat or drink low-fat dairy products, such as skim milk or low-fat yogurt. Fill one fourth of your plate at each meal with low-fat (lean) proteins. Low-fat proteins include fish, chicken without skin, eggs, beans, and tofu. Avoid fatty meat, cured and processed meat, or chicken with skin. Avoid pre-made or processed food. Limit the amount of salt in your diet to less than 1,500 mg each day. Do not drink alcohol if: Your doctor tells you not to drink. You are pregnant, may be pregnant, or are planning to become pregnant. If you drink alcohol: Limit how much you have to: 0-1 drink a day for women. 0-2 drinks a day for men. Know how much alcohol is in your drink. In the [place] one drink equals one 12 oz bottle of beer (355 mL), one 5 oz glass of wine (148 mL), or one 1 oz glass of hard liquor (44 mL). Lifestyle  Work with your doctor to stay at a healthy weight or to lose weight. Ask your doctor what the best weight is for you. Get at least 30 minutes of exercise that causes your heart to beat faster (aerobic exercise) most days of the week. This may include walking, swimming, or biking. Get at least 30 minutes of exercise that strengthens your muscles (resistance exercise) at least 3 days a week. This may include lifting weights or doing Pilates. Do not smoke or use any products that contain nicotine or tobacco. If you need help quitting, ask your doctor. Check your blood pressure at home as told by your doctor. Keep all follow-up visits. Medicines Take over-the-counter and prescription medicines  only as told by your doctor. Follow directions carefully. Do not skip doses of blood pressure medicine. The medicine does not work as well if you skip doses. Skipping doses also puts you at risk for problems. Ask your doctor about side effects or reactions to medicines that you should watch  for. Contact a doctor if: You think you are having a reaction to the medicine you are taking. You have headaches that keep coming back. You feel dizzy. You have swelling in your ankles. You have trouble with your vision. Get help right away if: You get a very bad headache. You start to feel mixed up (confused). You feel weak or numb. You feel faint. You have very bad pain in your: Chest. Belly (abdomen). You vomit more than once. You have trouble breathing. These symptoms may be an emergency. Get help right away. Call 911. Do not wait to see if the symptoms will go away. Do not drive yourself to the hospital. Summary Hypertension is another name for high blood pressure. High blood pressure forces your heart to work harder to pump blood. For most people, a normal blood pressure is less than 120/80. Making healthy choices can help lower blood pressure. If your blood pressure does not get lower with healthy choices, you may need to take medicine. This information is not intended to replace advice given to you by your health care provider. Make sure you discuss any questions you have with your health care provider. Document Revised: 10/05/2021 Document Reviewed: 10/05/2021 Elsevier Patient Education  2024 [employer].

## 2024-02-27 NOTE — Assessment & Plan Note (Signed)
 Elevated LFTs noted on previous labwork, I will recheck today. He admits to alcohol use daily, he is advised to decrease use. If persistently elevated, would recommend liver ultrasound.

## 2024-02-27 NOTE — Progress Notes (Signed)
 I,Victoria T Deloria Lair, CMA,acting as a Neurosurgeon for Gwynneth Aliment, MD.,have documented all relevant documentation on the behalf of Gwynneth Aliment, MD,as directed by  Gwynneth Aliment, MD while in the presence of Gwynneth Aliment, MD.  Subjective:  Patient ID: Luis Ward , male    DOB: 1970-01-02 , 54 y.o.   MRN: [id number]  Chief Complaint  Patient presents with   Hypertension    HPI  Patient presents today for elevated bp. His PCP is Dr. Alvy Bimler.  He is accompanied by his wife today. He reports being at the eye doctor today and was advised his BP was markedly elevaed, first reading: 170/110.  He admits his blood pressure was also elevated at a recent dental visit.  He was not able to receive treatment that day due to his elevated BP.  He does have h/o HTN.  He admits non-compliance with meds.  However, after he was advised of elevated BP last week, he resumed taking valsartan.  He denies having any headache, chest pain, sob & blurred vision.    Hypertension This is a chronic problem. The current episode started more than 1 year ago. The problem is uncontrolled. Pertinent negatives include no blurred vision, chest pain, palpitations or shortness of breath. Risk factors for coronary artery disease include male gender.     Past Medical History:  Diagnosis Date   ED (erectile dysfunction)    Hypertension      Family History  Problem Relation Age of Onset   Heart disease Father    Hyperlipidemia Father    Hypertension Father    Diabetes Father    Colon cancer Neg Hx    Colon polyps Neg Hx    Stomach cancer Neg Hx    Esophageal cancer Neg Hx    Pancreatic cancer Neg Hx      Current Outpatient Medications:    dicyclomine (BENTYL) 20 MG tablet, Take 1 tablet (20 mg total) by mouth every 6 (six) hours as needed for spasms., Disp: 20 tablet, Rfl: 1   sildenafil (VIAGRA) 100 MG tablet, TAKE 1/2 TO 1 TABLET BY MOUTH DAILY AS NEEDED FOR ERECTILE DYSFUNCTION., Disp: 5 tablet, Rfl: 1    valsartan (DIOVAN) 160 MG tablet, Take 1 tablet (160 mg total) by mouth daily., Disp: 90 tablet, Rfl: 3   amLODipine (NORVASC) 2.5 MG tablet, Take 1 tablet (2.5 mg total) by mouth daily., Disp: 30 tablet, Rfl: 0   No Known Allergies   Review of Systems  Constitutional: Negative.   HENT: Negative.    Eyes:  Negative for blurred vision.  Respiratory: Negative.  Negative for shortness of breath.   Cardiovascular:  Negative for chest pain and palpitations.  Gastrointestinal: Negative.   Genitourinary: Negative.   Skin: Negative.   Allergic/Immunologic: Negative.   Neurological: Negative.   Hematological: Negative.      Today's Vitals   02/27/24 1612 02/27/24 1630  BP: (!) 150/110 (!) 160/110  Temp: 98.2 F (36.8 C)   SpO2: 98%   Weight: 197 lb 3.2 oz (89.4 kg)   Height: 6\' 1"  (1.854 m)    Body mass index is 26.02 kg/m.  Wt Readings from Last 3 Encounters:  02/27/24 197 lb 3.2 oz (89.4 kg)  04/04/23 196 lb (88.9 kg)  02/26/23 194 lb 6 oz (88.2 kg)    BP Readings from Last 3 Encounters:  02/27/24 (!) 160/110  04/04/23 (!) 156/86  02/26/23 120/82     Objective:  Physical Exam Vitals and  nursing note reviewed.  Constitutional:      Appearance: Normal appearance.  HENT:     Head: Normocephalic and atraumatic.  Eyes:     Extraocular Movements: Extraocular movements intact.  Cardiovascular:     Rate and Rhythm: Normal rate and regular rhythm.     Heart sounds: Normal heart sounds.  Pulmonary:     Effort: Pulmonary effort is normal.     Breath sounds: Normal breath sounds.  Musculoskeletal:     Cervical back: Normal range of motion.  Skin:    General: Skin is warm.  Neurological:     General: No focal deficit present.     Mental Status: He is alert.  Psychiatric:        Mood and Affect: Mood normal.         Assessment And Plan:  Essential hypertension Assessment & Plan: Chronic, uncontrolled.  He admits non-compliance with meds. Most recent labs reviewed,  01/2023, nl renal function.  He was given information on the DASH diet.  Due to the marked elevation of bp, I do not think increasing dose of valsartan is ideal.  He will continue with taking valsartan 160mg  in AM.   I will start him on amlodipine 2.5mg  to be taken nightly, advised to f/u with PCP in 2 weeks for f/u.  Pt is aware dose will likely need to be titrated upward.  I also contacted PCP via Securechat and updated him on treatment plan. Patient is aware the dose will likely be titrated. Importance of salt restriction was also discussed with the patient. All questions were answered to his satisfaction.  He agrees to [employer] as below, results will be forwarded to patient in Mychart and to his PCP.   Of note, he does admit to daily alcohol use, this likely contributes to his BP elevation.   Orders: -     Comprehensive metabolic panel  Abnormal liver function Assessment & Plan: Elevated LFTs noted on previous labwork, I will recheck today. He admits to alcohol use daily, he is advised to decrease use. If persistently elevated, would recommend liver ultrasound.    Other orders -     amLODIPine Besylate; Take 1 tablet (2.5 mg total) by mouth daily.  Dispense: 30 tablet; Refill: 0     Return if symptoms worsen or fail to improve.  Patient was given opportunity to ask questions. Patient verbalized understanding of the plan and was able to repeat key elements of the plan. All questions were answered to their satisfaction.    I, Gwynneth Aliment, MD, have reviewed all documentation for this visit. The documentation on 02/27/24 for the exam, diagnosis, procedures, and orders are all accurate and complete.   IF YOU HAVE BEEN REFERRED TO A SPECIALIST, IT MAY TAKE 1-2 WEEKS TO SCHEDULE/PROCESS THE REFERRAL. IF YOU HAVE NOT HEARD FROM US/SPECIALIST IN TWO WEEKS, PLEASE GIVE [place] A CALL AT [phone] X [phone].   THE PATIENT IS ENCOURAGED TO PRACTICE SOCIAL DISTANCING DUE TO THE COVID-19 PANDEMIC.

## 2024-02-28 LAB — COMPREHENSIVE METABOLIC PANEL
AG Ratio: 1.4 (calc) (ref 1.0–2.5)
ALT: 64 U/L — ABNORMAL HIGH (ref 9–46)
AST: 61 U/L — ABNORMAL HIGH (ref 10–35)
Albumin: 4.5 g/dL (ref 3.6–5.1)
Alkaline phosphatase (APISO): 40 U/L (ref 35–144)
BUN: 13 mg/dL (ref 7–25)
CO2: 27 mmol/L (ref 20–32)
Calcium: 9.8 mg/dL (ref 8.6–10.3)
Chloride: 99 mmol/L (ref 98–110)
Creat: 1.1 mg/dL (ref 0.70–1.30)
Globulin: 3.3 g/dL (ref 1.9–3.7)
Glucose, Bld: 89 mg/dL (ref 65–99)
Potassium: 4.7 mmol/L (ref 3.5–5.3)
Sodium: 137 mmol/L (ref 135–146)
Total Bilirubin: 0.7 mg/dL (ref 0.2–1.2)
Total Protein: 7.8 g/dL (ref 6.1–8.1)

## 2024-03-10 ENCOUNTER — Other Ambulatory Visit: Payer: Self-pay | Admitting: Emergency Medicine

## 2024-03-10 DIAGNOSIS — I10 Essential (primary) hypertension: Secondary | ICD-10-CM

## 2024-03-16 ENCOUNTER — Encounter: Payer: Self-pay | Admitting: Emergency Medicine

## 2024-03-16 ENCOUNTER — Ambulatory Visit (INDEPENDENT_AMBULATORY_CARE_PROVIDER_SITE_OTHER): Payer: Self-pay | Admitting: Emergency Medicine

## 2024-03-16 VITALS — BP 142/88 | HR 97 | Temp 98.7°F | Ht 73.0 in | Wt 197.0 lb

## 2024-03-16 DIAGNOSIS — E785 Hyperlipidemia, unspecified: Secondary | ICD-10-CM

## 2024-03-16 DIAGNOSIS — R945 Abnormal results of liver function studies: Secondary | ICD-10-CM

## 2024-03-16 DIAGNOSIS — I10 Essential (primary) hypertension: Secondary | ICD-10-CM

## 2024-03-16 MED ORDER — AMLODIPINE BESYLATE 5 MG PO TABS: 5.0000 mg | ORAL_TABLET | Freq: Every day | ORAL | 3 refills | Status: DC

## 2024-03-16 NOTE — Assessment & Plan Note (Addendum)
 Elevated blood pressure reading in the office and at home Recommend to continue valsartan 160 mg daily and increase amlodipine to 5 mg daily. Cardiovascular risks associated with hypertension discussed Diet and nutrition discussed Normal renal function. Recent CMP shows mild elevation of liver enzymes Benefits of exercise discussed Advised to monitor blood pressure readings at home daily for the next several weeks and keep a log.  Advised to contact the office if numbers persistently abnormal. Follow-up in 3 months

## 2024-03-16 NOTE — Assessment & Plan Note (Signed)
 Elevated LFTs noted on previous labwork, I will recheck today. He admits to alcohol use daily, he is advised to decrease use. If persistently elevated, would recommend liver ultrasound.

## 2024-03-16 NOTE — Assessment & Plan Note (Signed)
 Diet and nutrition discussed Benefits of exercise discussed We will repeat lipid profile next visit

## 2024-03-16 NOTE — Progress Notes (Signed)
 Luis Ward 54 y.o.   Chief Complaint  Patient presents with   Hypertension    Patient states his b/p is still high. States he got it checked at the [facility] and one of the readings was 170/110. He states he takes his b/p medications everyday.     HISTORY OF PRESENT ILLNESS: This is a 54 y.o. male here for follow-up of hypertension Elevated blood pressure readings at [facility] office Seen by internal medicine Dr. Gerilyn Nestle recently and started on amlodipine 2.5 mg in addition to his regular valsartan 160 mg daily. No other concerns or medical complaints today Still drinking a little bit more than he should.  Hypertension Pertinent negatives include no chest pain, headaches, palpitations or shortness of breath.     Prior to Admission medications   Medication Sig Start Date End Date Taking? Authorizing Provider  amLODipine (NORVASC) 2.5 MG tablet Take 1 tablet (2.5 mg total) by mouth daily. 02/27/24 02/26/25 Yes Dorothyann Peng, MD  valsartan (DIOVAN) 160 MG tablet TAKE 1 TABLET BY MOUTH EVERY DAY 03/10/24  Yes Mitsue Peery, Eilleen Kempf, MD  dicyclomine (BENTYL) 20 MG tablet Take 1 tablet (20 mg total) by mouth every 6 (six) hours as needed for spasms. Patient not taking: Reported on 03/16/2024 02/26/23   Georgina Quint, MD  sildenafil (VIAGRA) 100 MG tablet TAKE 1/2 TO 1 TABLET BY MOUTH DAILY AS NEEDED FOR ERECTILE DYSFUNCTION. Patient not taking: Reported on 03/16/2024 01/25/24   Georgina Quint, MD    No Known Allergies  Patient Active Problem List   Diagnosis Date Noted   Abnormal liver function 02/27/2024   Chronic diarrhea 02/26/2023   Erectile dysfunction 06/08/2021   Dyslipidemia 01/26/2021   Essential hypertension 01/15/2019   Chronic left flank pain 10/02/2018    Past Medical History:  Diagnosis Date   ED (erectile dysfunction)    Hypertension     Past Surgical History:  Procedure Laterality Date   NO PAST SURGERIES       Social History   Socioeconomic History   Marital status: Married    Spouse name: Not on file   Number of children: 4   Years of education: Not on file   Highest education level: Not on file  Occupational History   Occupation: Child psychotherapist  Tobacco Use   Smoking status: Former    Types: Cigarettes   Smokeless tobacco: Never  Vaping Use   Vaping status: Never Used  Substance and Sexual Activity   Alcohol use: Yes    Comment: everyday between 3-6 drinks   Drug use: No   Sexual activity: Yes  Other Topics Concern   Not on file  Social History Narrative   Not on file   Social Drivers of Health   Financial Resource Strain: Not on file  Food Insecurity: Not on file  Transportation Needs: Not on file  Physical Activity: Not on file  Stress: Not on file  Social Connections: Not on file  Intimate Partner Violence: Not on file    Family History  Problem Relation Age of Onset   Heart disease Father    Hyperlipidemia Father    Hypertension Father    Diabetes Father    Colon cancer Neg Hx    Colon polyps Neg Hx    Stomach cancer Neg Hx    Esophageal cancer Neg Hx    Pancreatic cancer Neg Hx      Review of Systems  Constitutional: Negative.  Negative for  chills and fever.  HENT: Negative.  Negative for congestion and sore throat.   Respiratory: Negative.  Negative for cough and shortness of breath.   Cardiovascular: Negative.  Negative for chest pain and palpitations.  Gastrointestinal:  Negative for abdominal pain, diarrhea, nausea and vomiting.  Genitourinary: Negative.  Negative for dysuria and hematuria.  Skin: Negative.  Negative for rash.  Neurological: Negative.  Negative for dizziness and headaches.  All other systems reviewed and are negative.   Today's Vitals   03/16/24 1305  BP: (!) 142/88  Pulse: 97  Temp: 98.7 F (37.1 C)  TempSrc: Oral  SpO2: 96%  Weight: 197 lb (89.4 kg)  Height: 6\' 1"  (1.854 m)   Body mass index is 25.99  kg/m.   Physical Exam Vitals reviewed.  Constitutional:      Appearance: Normal appearance.  HENT:     Head: Normocephalic.     Mouth/Throat:     Mouth: Mucous membranes are moist.     Pharynx: Oropharynx is clear.  Eyes:     Extraocular Movements: Extraocular movements intact.     Pupils: Pupils are equal, round, and reactive to light.  Cardiovascular:     Rate and Rhythm: Normal rate and regular rhythm.     Pulses: Normal pulses.     Heart sounds: Normal heart sounds.  Pulmonary:     Effort: Pulmonary effort is normal.     Breath sounds: Normal breath sounds.  Musculoskeletal:     Cervical back: No tenderness.  Lymphadenopathy:     Cervical: No cervical adenopathy.  Skin:    General: Skin is warm and dry.     Capillary Refill: Capillary refill takes less than 2 seconds.  Neurological:     General: No focal deficit present.     Mental Status: He is alert and oriented to person, place, and time.  Psychiatric:        Mood and Affect: Mood normal.        Behavior: Behavior normal.      ASSESSMENT & PLAN:  Problem List Items Addressed This Visit       Cardiovascular and Mediastinum   Essential hypertension - Primary   Elevated blood pressure reading in the office and at home Recommend to continue valsartan 160 mg daily and increase amlodipine to 5 mg daily. Cardiovascular risks associated with hypertension discussed Diet and nutrition discussed Normal renal function. Recent CMP shows mild elevation of liver enzymes Benefits of exercise discussed Advised to monitor blood pressure readings at home daily for the next several weeks and keep a log.  Advised to contact the office if numbers persistently abnormal. Follow-up in 3 months      Relevant Medications   amLODipine (NORVASC) 5 MG tablet     Other   Dyslipidemia   Diet and nutrition discussed Benefits of exercise discussed We will repeat lipid profile next visit      Abnormal liver function    Elevated LFTs noted on previous labwork, I will recheck today. He admits to alcohol use daily, he is advised to decrease use. If persistently elevated, would recommend liver ultrasound.       Patient Instructions  Hypertension, Adult High blood pressure (hypertension) is when the force of blood pumping through the arteries is too strong. The arteries are the blood vessels that carry blood from the heart throughout the body. Hypertension forces the heart to work harder to pump blood and may cause arteries to become narrow or stiff. Untreated or uncontrolled  hypertension can lead to a heart attack, heart failure, a stroke, kidney disease, and other problems. A blood pressure reading consists of a higher number over a lower number. Ideally, your blood pressure should be below 120/80. The first ("top") number is called the systolic pressure. It is a measure of the pressure in your arteries as your heart beats. The second ("bottom") number is called the diastolic pressure. It is a measure of the pressure in your arteries as the heart relaxes. What are the causes? The exact cause of this condition is not known. There are some conditions that result in high blood pressure. What increases the risk? Certain factors may make you more likely to develop high blood pressure. Some of these risk factors are under your control, including: Smoking. Not getting enough exercise or physical activity. Being overweight. Having too much fat, sugar, calories, or salt (sodium) in your diet. Drinking too much alcohol. Other risk factors include: Having a personal history of heart disease, diabetes, high cholesterol, or kidney disease. Stress. Having a family history of high blood pressure and high cholesterol. Having obstructive sleep apnea. Age. The risk increases with age. What are the signs or symptoms? High blood pressure may not cause symptoms. Very high blood pressure (hypertensive crisis) may  cause: Headache. Fast or irregular heartbeats (palpitations). Shortness of breath. Nosebleed. Nausea and vomiting. Vision changes. Severe chest pain, dizziness, and seizures. How is this diagnosed? This condition is diagnosed by measuring your blood pressure while you are seated, with your arm resting on a flat surface, your legs uncrossed, and your feet flat on the floor. The cuff of the blood pressure monitor will be placed directly against the skin of your upper arm at the level of your heart. Blood pressure should be measured at least twice using the same arm. Certain conditions can cause a difference in blood pressure between your right and left arms. If you have a high blood pressure reading during one visit or you have normal blood pressure with other risk factors, you may be asked to: Return on a different day to have your blood pressure checked again. Monitor your blood pressure at home for 1 week or longer. If you are diagnosed with hypertension, you may have other blood or imaging tests to help your health care provider understand your overall risk for other conditions. How is this treated? This condition is treated by making healthy lifestyle changes, such as eating healthy foods, exercising more, and reducing your alcohol intake. You may be referred for counseling on a healthy diet and physical activity. Your health care provider may prescribe medicine if lifestyle changes are not enough to get your blood pressure under control and if: Your systolic blood pressure is above 130. Your diastolic blood pressure is above 80. Your personal target blood pressure may vary depending on your medical conditions, your age, and other factors. Follow these instructions at home: Eating and drinking  Eat a diet that is high in fiber and potassium, and low in sodium, added sugar, and fat. An example of this eating plan is called the DASH diet. DASH stands for Dietary Approaches to Stop  Hypertension. To eat this way: Eat plenty of fresh fruits and vegetables. Try to fill one half of your plate at each meal with fruits and vegetables. Eat whole grains, such as whole-wheat pasta, brown rice, or whole-grain bread. Fill about one fourth of your plate with whole grains. Eat or drink low-fat dairy products, such as skim milk or low-fat  yogurt. Avoid fatty cuts of meat, processed or cured meats, and poultry with skin. Fill about one fourth of your plate with lean proteins, such as fish, chicken without skin, beans, eggs, or tofu. Avoid pre-made and processed foods. These tend to be higher in sodium, added sugar, and fat. Reduce your daily sodium intake. Many people with hypertension should eat less than 1,500 mg of sodium a day. Do not drink alcohol if: Your health care provider tells you not to drink. You are pregnant, may be pregnant, or are planning to become pregnant. If you drink alcohol: Limit how much you have to: 0-1 drink a day for women. 0-2 drinks a day for men. Know how much alcohol is in your drink. In the [place] one drink equals one 12 oz bottle of beer (355 mL), one 5 oz glass of wine (148 mL), or one 1 oz glass of hard liquor (44 mL). Lifestyle  Work with your health care provider to maintain a healthy body weight or to lose weight. Ask what an ideal weight is for you. Get at least 30 minutes of exercise that causes your heart to beat faster (aerobic exercise) most days of the week. Activities may include walking, swimming, or biking. Include exercise to strengthen your muscles (resistance exercise), such as Pilates or lifting weights, as part of your weekly exercise routine. Try to do these types of exercises for 30 minutes at least 3 days a week. Do not use any products that contain nicotine or tobacco. These products include cigarettes, chewing tobacco, and vaping devices, such as e-cigarettes. If you need help quitting, ask your health care provider. Monitor your  blood pressure at home as told by your health care provider. Keep all follow-up visits. This is important. Medicines Take over-the-counter and prescription medicines only as told by your health care provider. Follow directions carefully. Blood pressure medicines must be taken as prescribed. Do not skip doses of blood pressure medicine. Doing this puts you at risk for problems and can make the medicine less effective. Ask your health care provider about side effects or reactions to medicines that you should watch for. Contact a health care provider if you: Think you are having a reaction to a medicine you are taking. Have headaches that keep coming back (recurring). Feel dizzy. Have swelling in your ankles. Have trouble with your vision. Get help right away if you: Develop a severe headache or confusion. Have unusual weakness or numbness. Feel faint. Have severe pain in your chest or abdomen. Vomit repeatedly. Have trouble breathing. These symptoms may be an emergency. Get help right away. Call 911. Do not wait to see if the symptoms will go away. Do not drive yourself to the hospital. Summary Hypertension is when the force of blood pumping through your arteries is too strong. If this condition is not controlled, it may put you at risk for serious complications. Your personal target blood pressure may vary depending on your medical conditions, your age, and other factors. For most people, a normal blood pressure is less than 120/80. Hypertension is treated with lifestyle changes, medicines, or a combination of both. Lifestyle changes include losing weight, eating a healthy, low-sodium diet, exercising more, and limiting alcohol. This information is not intended to replace advice given to you by your health care provider. Make sure you discuss any questions you have with your health care provider. Document Revised: 10/24/2021 Document Reviewed: 10/24/2021 Elsevier Patient Education  2024  [employer].     [place],  MD [facility]

## 2024-03-16 NOTE — Patient Instructions (Signed)
 Hypertension, Adult High blood pressure (hypertension) is when the force of blood pumping through the arteries is too strong. The arteries are the blood vessels that carry blood from the heart throughout the body. Hypertension forces the heart to work harder to pump blood and may cause arteries to become narrow or stiff. Untreated or uncontrolled hypertension can lead to a heart attack, heart failure, a stroke, kidney disease, and other problems. A blood pressure reading consists of a higher number over a lower number. Ideally, your blood pressure should be below 120/80. The first ("top") number is called the systolic pressure. It is a measure of the pressure in your arteries as your heart beats. The second ("bottom") number is called the diastolic pressure. It is a measure of the pressure in your arteries as the heart relaxes. What are the causes? The exact cause of this condition is not known. There are some conditions that result in high blood pressure. What increases the risk? Certain factors may make you more likely to develop high blood pressure. Some of these risk factors are under your control, including: Smoking. Not getting enough exercise or physical activity. Being overweight. Having too much fat, sugar, calories, or salt (sodium) in your diet. Drinking too much alcohol. Other risk factors include: Having a personal history of heart disease, diabetes, high cholesterol, or kidney disease. Stress. Having a family history of high blood pressure and high cholesterol. Having obstructive sleep apnea. Age. The risk increases with age. What are the signs or symptoms? High blood pressure may not cause symptoms. Very high blood pressure (hypertensive crisis) may cause: Headache. Fast or irregular heartbeats (palpitations). Shortness of breath. Nosebleed. Nausea and vomiting. Vision changes. Severe chest pain, dizziness, and seizures. How is this diagnosed? This condition is diagnosed by  measuring your blood pressure while you are seated, with your arm resting on a flat surface, your legs uncrossed, and your feet flat on the floor. The cuff of the blood pressure monitor will be placed directly against the skin of your upper arm at the level of your heart. Blood pressure should be measured at least twice using the same arm. Certain conditions can cause a difference in blood pressure between your right and left arms. If you have a high blood pressure reading during one visit or you have normal blood pressure with other risk factors, you may be asked to: Return on a different day to have your blood pressure checked again. Monitor your blood pressure at home for 1 week or longer. If you are diagnosed with hypertension, you may have other blood or imaging tests to help your health care provider understand your overall risk for other conditions. How is this treated? This condition is treated by making healthy lifestyle changes, such as eating healthy foods, exercising more, and reducing your alcohol intake. You may be referred for counseling on a healthy diet and physical activity. Your health care provider may prescribe medicine if lifestyle changes are not enough to get your blood pressure under control and if: Your systolic blood pressure is above 130. Your diastolic blood pressure is above 80. Your personal target blood pressure may vary depending on your medical conditions, your age, and other factors. Follow these instructions at home: Eating and drinking  Eat a diet that is high in fiber and potassium, and low in sodium, added sugar, and fat. An example of this eating plan is called the DASH diet. DASH stands for Dietary Approaches to Stop Hypertension. To eat this way: Eat  plenty of fresh fruits and vegetables. Try to fill one half of your plate at each meal with fruits and vegetables. Eat whole grains, such as whole-wheat pasta, brown rice, or whole-grain bread. Fill about one  fourth of your plate with whole grains. Eat or drink low-fat dairy products, such as skim milk or low-fat yogurt. Avoid fatty cuts of meat, processed or cured meats, and poultry with skin. Fill about one fourth of your plate with lean proteins, such as fish, chicken without skin, beans, eggs, or tofu. Avoid pre-made and processed foods. These tend to be higher in sodium, added sugar, and fat. Reduce your daily sodium intake. Many people with hypertension should eat less than 1,500 mg of sodium a day. Do not drink alcohol if: Your health care provider tells you not to drink. You are pregnant, may be pregnant, or are planning to become pregnant. If you drink alcohol: Limit how much you have to: 0-1 drink a day for women. 0-2 drinks a day for men. Know how much alcohol is in your drink. In the [place] one drink equals one 12 oz bottle of beer (355 mL), one 5 oz glass of wine (148 mL), or one 1 oz glass of hard liquor (44 mL). Lifestyle  Work with your health care provider to maintain a healthy body weight or to lose weight. Ask what an ideal weight is for you. Get at least 30 minutes of exercise that causes your heart to beat faster (aerobic exercise) most days of the week. Activities may include walking, swimming, or biking. Include exercise to strengthen your muscles (resistance exercise), such as Pilates or lifting weights, as part of your weekly exercise routine. Try to do these types of exercises for 30 minutes at least 3 days a week. Do not use any products that contain nicotine or tobacco. These products include cigarettes, chewing tobacco, and vaping devices, such as e-cigarettes. If you need help quitting, ask your health care provider. Monitor your blood pressure at home as told by your health care provider. Keep all follow-up visits. This is important. Medicines Take over-the-counter and prescription medicines only as told by your health care provider. Follow directions carefully. Blood  pressure medicines must be taken as prescribed. Do not skip doses of blood pressure medicine. Doing this puts you at risk for problems and can make the medicine less effective. Ask your health care provider about side effects or reactions to medicines that you should watch for. Contact a health care provider if you: Think you are having a reaction to a medicine you are taking. Have headaches that keep coming back (recurring). Feel dizzy. Have swelling in your ankles. Have trouble with your vision. Get help right away if you: Develop a severe headache or confusion. Have unusual weakness or numbness. Feel faint. Have severe pain in your chest or abdomen. Vomit repeatedly. Have trouble breathing. These symptoms may be an emergency. Get help right away. Call 911. Do not wait to see if the symptoms will go away. Do not drive yourself to the hospital. Summary Hypertension is when the force of blood pumping through your arteries is too strong. If this condition is not controlled, it may put you at risk for serious complications. Your personal target blood pressure may vary depending on your medical conditions, your age, and other factors. For most people, a normal blood pressure is less than 120/80. Hypertension is treated with lifestyle changes, medicines, or a combination of both. Lifestyle changes include losing weight, eating a healthy,  low-sodium diet, exercising more, and limiting alcohol. This information is not intended to replace advice given to you by your health care provider. Make sure you discuss any questions you have with your health care provider. Document Revised: 10/24/2021 Document Reviewed: 10/24/2021 Elsevier Patient Education  2024 [employer].

## 2024-03-19 ENCOUNTER — Ambulatory Visit: Payer: Self-pay | Admitting: Emergency Medicine

## 2024-05-19 ENCOUNTER — Encounter: Payer: Self-pay | Admitting: Emergency Medicine

## 2024-06-03 ENCOUNTER — Ambulatory Visit (INDEPENDENT_AMBULATORY_CARE_PROVIDER_SITE_OTHER): Payer: Self-pay | Admitting: Emergency Medicine

## 2024-06-03 ENCOUNTER — Encounter: Payer: Self-pay | Admitting: Emergency Medicine

## 2024-06-03 VITALS — BP 128/78 | HR 86 | Temp 99.0°F | Ht 73.0 in | Wt 193.0 lb

## 2024-06-03 DIAGNOSIS — I10 Essential (primary) hypertension: Secondary | ICD-10-CM | POA: Diagnosis not present

## 2024-06-03 DIAGNOSIS — R7401 Elevation of levels of liver transaminase levels: Secondary | ICD-10-CM | POA: Insufficient documentation

## 2024-06-03 DIAGNOSIS — E785 Hyperlipidemia, unspecified: Secondary | ICD-10-CM | POA: Diagnosis not present

## 2024-06-03 DIAGNOSIS — R945 Abnormal results of liver function studies: Secondary | ICD-10-CM

## 2024-06-03 DIAGNOSIS — R600 Localized edema: Secondary | ICD-10-CM | POA: Insufficient documentation

## 2024-06-03 MED ORDER — VALSARTAN-HYDROCHLOROTHIAZIDE 320-12.5 MG PO TABS: 1.0000 | ORAL_TABLET | Freq: Every day | ORAL | 3 refills | Status: AC

## 2024-06-03 NOTE — Assessment & Plan Note (Signed)
 BP Readings from Last 3 Encounters:  06/03/24 128/78  03/16/24 (!) 142/88  02/27/24 (!) 160/110  Well-controlled hypertension.  However ankle/feet edema started after amlodipine  was initiated. Recommend to stop amlodipine  and change valsartan  to valsartan  HCT 320-12.5 mg daily Advised to monitor blood pressure readings daily for the next several weeks and contact office if numbers persistently abnormal Will repeat blood work today Follow-up in 3 months.  Earlier as needed

## 2024-06-03 NOTE — Assessment & Plan Note (Signed)
 Most likely secondary to chronic EtOH use May also have fatty liver Recommend liver ultrasound

## 2024-06-03 NOTE — Progress Notes (Signed)
 Luis Ward 54 y.o.   Chief Complaint  Patient presents with   Foot Swelling    Patient states on may 26th he got bite by a dog and then stubbed he foot the same weekend, and has been swelling since then. Patient states he can feel his skin move when he walks . Patient states he does lay with his feet elevated but it does take hours for it to go down. Its more his right foot than left     HISTORY OF PRESENT ILLNESS: This is a 54 y.o. male complaining of back lateral ankle and feet edema for the last couple weeks No other associated symptoms. History of hypertension on valsartan  and amlodipine  During last office visit amlodipine  was increased to 5 mg daily BP Readings from Last 3 Encounters:  03/16/24 (!) 142/88  02/27/24 (!) 160/110  04/04/23 (!) 156/86     HPI   Prior to Admission medications   Medication Sig Start Date End Date Taking? Authorizing Provider  amLODipine  (NORVASC ) 5 MG tablet Take 1 tablet (5 mg total) by mouth daily. 03/16/24  Yes Zyiere Rosemond, Isidro Margo, MD  valsartan  (DIOVAN ) 160 MG tablet TAKE 1 TABLET BY MOUTH EVERY DAY 03/10/24  Yes Jeronica Stlouis, Isidro Margo, MD  dicyclomine  (BENTYL ) 20 MG tablet Take 1 tablet (20 mg total) by mouth every 6 (six) hours as needed for spasms. Patient not taking: Reported on 06/03/2024 02/26/23   Elvira Hammersmith, MD  sildenafil  (VIAGRA ) 100 MG tablet TAKE 1/2 TO 1 TABLET BY MOUTH DAILY AS NEEDED FOR ERECTILE DYSFUNCTION. Patient not taking: Reported on 06/03/2024 01/25/24   Elvira Hammersmith, MD    No Known Allergies  Patient Active Problem List   Diagnosis Date Noted   Abnormal liver function 02/27/2024   Chronic diarrhea 02/26/2023   Erectile dysfunction 06/08/2021   Dyslipidemia 01/26/2021   Essential hypertension 01/15/2019   Chronic left flank pain 10/02/2018    Past Medical History:  Diagnosis Date   ED (erectile dysfunction)    Hypertension     Past Surgical History:  Procedure Laterality Date   NO PAST  SURGERIES      Social History   Socioeconomic History   Marital status: Married    Spouse name: Not on file   Number of children: 4   Years of education: Not on file   Highest education level: Not on file  Occupational History   Occupation: Child psychotherapist  Tobacco Use   Smoking status: Former    Types: Cigarettes   Smokeless tobacco: Never  Vaping Use   Vaping status: Never Used  Substance and Sexual Activity   Alcohol use: Yes    Comment: everyday between 3-6 drinks   Drug use: No   Sexual activity: Yes  Other Topics Concern   Not on file  Social History Narrative   Not on file   Social Drivers of Health   Financial Resource Strain: Not on file  Food Insecurity: Not on file  Transportation Needs: Not on file  Physical Activity: Not on file  Stress: Not on file  Social Connections: Not on file  Intimate Partner Violence: Not on file    Family History  Problem Relation Age of Onset   Heart disease Father    Hyperlipidemia Father    Hypertension Father    Diabetes Father    Colon cancer Neg Hx    Colon polyps Neg Hx    Stomach cancer Neg Hx    Esophageal cancer Neg Hx  Pancreatic cancer Neg Hx      Review of Systems  Constitutional: Negative.  Negative for chills and fever.  HENT: Negative.  Negative for congestion and sore throat.   Respiratory: Negative.  Negative for cough and shortness of breath.   Cardiovascular:  Positive for leg swelling. Negative for chest pain and palpitations.  Gastrointestinal:  Negative for abdominal pain, diarrhea, nausea and vomiting.  Genitourinary: Negative.  Negative for dysuria and hematuria.  Skin: Negative.  Negative for rash.  Neurological: Negative.  Negative for dizziness and headaches.  All other systems reviewed and are negative.   Today's Vitals   06/03/24 1601  BP: 128/78  Pulse: 86  Temp: 99 F (37.2 C)  TempSrc: Oral  SpO2: 97%  Weight: 193 lb (87.5 kg)  Height: 6\' 1"  (1.854 m)   Body mass index  is 25.46 kg/m.   Physical Exam Vitals reviewed.  Constitutional:      Appearance: Normal appearance.  HENT:     Head: Normocephalic.  Eyes:     Extraocular Movements: Extraocular movements intact.  Cardiovascular:     Rate and Rhythm: Normal rate.  Pulmonary:     Effort: Pulmonary effort is normal.  Musculoskeletal:     Comments: Mild edema both ankles and feet  Skin:    General: Skin is warm and dry.     Capillary Refill: Capillary refill takes less than 2 seconds.     Comments: Chronic changes of chronic venous insufficiency lower extremities  Neurological:     General: No focal deficit present.     Mental Status: He is alert and oriented to person, place, and time.  Psychiatric:        Mood and Affect: Mood normal.        Behavior: Behavior normal.      ASSESSMENT & PLAN:  Problem List Items Addressed This Visit       Cardiovascular and Mediastinum   Essential hypertension - Primary   BP Readings from Last 3 Encounters:  06/03/24 128/78  03/16/24 (!) 142/88  02/27/24 (!) 160/110  Well-controlled hypertension.  However ankle/feet edema started after amlodipine  was initiated. Recommend to stop amlodipine  and change valsartan  to valsartan  HCT 320-12.5 mg daily Advised to monitor blood pressure readings daily for the next several weeks and contact office if numbers persistently abnormal Will repeat blood work today Follow-up in 3 months.  Earlier as needed       Relevant Medications   valsartan -hydrochlorothiazide  (DIOVAN -HCT) 320-12.5 MG tablet   Other Relevant Orders   CBC with Differential/Platelet   Comprehensive metabolic panel with GFR   Hemoglobin A1c   Lipid panel     Other   Dyslipidemia   Diet and nutrition discussed Benefits of exercise discussed Lipid profile done today      Relevant Orders   CBC with Differential/Platelet   Comprehensive metabolic panel with GFR   Hemoglobin A1c   Lipid panel   Abnormal liver function   Elevated LFTs  noted on previous labwork, I will recheck today. He admits to alcohol use daily, he is advised to decrease use. If persistently elevated, would recommend liver ultrasound.       Relevant Orders   CBC with Differential/Platelet   Comprehensive metabolic panel with GFR   Hemoglobin A1c   Lipid panel   US  Abdomen Limited RUQ (LIVER/GB)   Edema of both feet   Multifactorial.  Differential diagnosis discussed. Amlodipine  causes ankle/feet edema Patient has some signs of chronic venous insufficiency Chronic EtOH  intake with chronic transaminitis.  Chronic liver disease Recommend liver ultrasound       Relevant Orders   CBC with Differential/Platelet   Comprehensive metabolic panel with GFR   Hemoglobin A1c   Lipid panel   Transaminitis   Most likely secondary to chronic EtOH use May also have fatty liver Recommend liver ultrasound      Relevant Orders   US  Abdomen Limited RUQ (LIVER/GB)   Patient Instructions  Stop amlodipine  Stop valsartan  and start valsartan  HCT 320-12.5 mg daily Monitor blood pressure readings at home daily for the next several weeks and contact the office if numbers persistently abnormal.  Hypertension, Adult High blood pressure (hypertension) is when the force of blood pumping through the arteries is too strong. The arteries are the blood vessels that carry blood from the heart throughout the body. Hypertension forces the heart to work harder to pump blood and may cause arteries to become narrow or stiff. Untreated or uncontrolled hypertension can lead to a heart attack, heart failure, a stroke, kidney disease, and other problems. A blood pressure reading consists of a higher number over a lower number. Ideally, your blood pressure should be below 120/80. The first ("top") number is called the systolic pressure. It is a measure of the pressure in your arteries as your heart beats. The second ("bottom") number is called the diastolic pressure. It is a measure of  the pressure in your arteries as the heart relaxes. What are the causes? The exact cause of this condition is not known. There are some conditions that result in high blood pressure. What increases the risk? Certain factors may make you more likely to develop high blood pressure. Some of these risk factors are under your control, including: Smoking. Not getting enough exercise or physical activity. Being overweight. Having too much fat, sugar, calories, or salt (sodium) in your diet. Drinking too much alcohol. Other risk factors include: Having a personal history of heart disease, diabetes, high cholesterol, or kidney disease. Stress. Having a family history of high blood pressure and high cholesterol. Having obstructive sleep apnea. Age. The risk increases with age. What are the signs or symptoms? High blood pressure may not cause symptoms. Very high blood pressure (hypertensive crisis) may cause: Headache. Fast or irregular heartbeats (palpitations). Shortness of breath. Nosebleed. Nausea and vomiting. Vision changes. Severe chest pain, dizziness, and seizures. How is this diagnosed? This condition is diagnosed by measuring your blood pressure while you are seated, with your arm resting on a flat surface, your legs uncrossed, and your feet flat on the floor. The cuff of the blood pressure monitor will be placed directly against the skin of your upper arm at the level of your heart. Blood pressure should be measured at least twice using the same arm. Certain conditions can cause a difference in blood pressure between your right and left arms. If you have a high blood pressure reading during one visit or you have normal blood pressure with other risk factors, you may be asked to: Return on a different day to have your blood pressure checked again. Monitor your blood pressure at home for 1 week or longer. If you are diagnosed with hypertension, you may have other blood or imaging tests  to help your health care provider understand your overall risk for other conditions. How is this treated? This condition is treated by making healthy lifestyle changes, such as eating healthy foods, exercising more, and reducing your alcohol intake. You may be referred for counseling  on a healthy diet and physical activity. Your health care provider may prescribe medicine if lifestyle changes are not enough to get your blood pressure under control and if: Your systolic blood pressure is above 130. Your diastolic blood pressure is above 80. Your personal target blood pressure may vary depending on your medical conditions, your age, and other factors. Follow these instructions at home: Eating and drinking  Eat a diet that is high in fiber and potassium, and low in sodium, added sugar, and fat. An example of this eating plan is called the DASH diet. DASH stands for Dietary Approaches to Stop Hypertension. To eat this way: Eat plenty of fresh fruits and vegetables. Try to fill one half of your plate at each meal with fruits and vegetables. Eat whole grains, such as whole-wheat pasta, brown rice, or whole-grain bread. Fill about one fourth of your plate with whole grains. Eat or drink low-fat dairy products, such as skim milk or low-fat yogurt. Avoid fatty cuts of meat, processed or cured meats, and poultry with skin. Fill about one fourth of your plate with lean proteins, such as fish, chicken without skin, beans, eggs, or tofu. Avoid pre-made and processed foods. These tend to be higher in sodium, added sugar, and fat. Reduce your daily sodium intake. Many people with hypertension should eat less than 1,500 mg of sodium a day. Do not drink alcohol if: Your health care provider tells you not to drink. You are pregnant, may be pregnant, or are planning to become pregnant. If you drink alcohol: Limit how much you have to: 0-1 drink a day for women. 0-2 drinks a day for men. Know how much alcohol is  in your drink. In the [place] one drink equals one 12 oz bottle of beer (355 mL), one 5 oz glass of wine (148 mL), or one 1 oz glass of hard liquor (44 mL). Lifestyle  Work with your health care provider to maintain a healthy body weight or to lose weight. Ask what an ideal weight is for you. Get at least 30 minutes of exercise that causes your heart to beat faster (aerobic exercise) most days of the week. Activities may include walking, swimming, or biking. Include exercise to strengthen your muscles (resistance exercise), such as Pilates or lifting weights, as part of your weekly exercise routine. Try to do these types of exercises for 30 minutes at least 3 days a week. Do not use any products that contain nicotine or tobacco. These products include cigarettes, chewing tobacco, and vaping devices, such as e-cigarettes. If you need help quitting, ask your health care provider. Monitor your blood pressure at home as told by your health care provider. Keep all follow-up visits. This is important. Medicines Take over-the-counter and prescription medicines only as told by your health care provider. Follow directions carefully. Blood pressure medicines must be taken as prescribed. Do not skip doses of blood pressure medicine. Doing this puts you at risk for problems and can make the medicine less effective. Ask your health care provider about side effects or reactions to medicines that you should watch for. Contact a health care provider if you: Think you are having a reaction to a medicine you are taking. Have headaches that keep coming back (recurring). Feel dizzy. Have swelling in your ankles. Have trouble with your vision. Get help right away if you: Develop a severe headache or confusion. Have unusual weakness or numbness. Feel faint. Have severe pain in your chest or abdomen. Vomit repeatedly. Have  trouble breathing. These symptoms may be an emergency. Get help right away. Call 911. Do  not wait to see if the symptoms will go away. Do not drive yourself to the hospital. Summary Hypertension is when the force of blood pumping through your arteries is too strong. If this condition is not controlled, it may put you at risk for serious complications. Your personal target blood pressure may vary depending on your medical conditions, your age, and other factors. For most people, a normal blood pressure is less than 120/80. Hypertension is treated with lifestyle changes, medicines, or a combination of both. Lifestyle changes include losing weight, eating a healthy, low-sodium diet, exercising more, and limiting alcohol. This information is not intended to replace advice given to you by your health care provider. Make sure you discuss any questions you have with your health care provider. Document Revised: 10/24/2021 Document Reviewed: 10/24/2021 Elsevier Patient Education  2024 [employer]       Maryagnes Small, MD [facility]

## 2024-06-03 NOTE — Assessment & Plan Note (Signed)
 Elevated LFTs noted on previous labwork, I will recheck today. He admits to alcohol use daily, he is advised to decrease use. If persistently elevated, would recommend liver ultrasound.

## 2024-06-03 NOTE — Patient Instructions (Signed)
 Stop amlodipine  Stop valsartan  and start valsartan  HCT 320-12.5 mg daily Monitor blood pressure readings at home daily for the next several weeks and contact the office if numbers persistently abnormal.  Hypertension, Adult High blood pressure (hypertension) is when the force of blood pumping through the arteries is too strong. The arteries are the blood vessels that carry blood from the heart throughout the body. Hypertension forces the heart to work harder to pump blood and may cause arteries to become narrow or stiff. Untreated or uncontrolled hypertension can lead to a heart attack, heart failure, a stroke, kidney disease, and other problems. A blood pressure reading consists of a higher number over a lower number. Ideally, your blood pressure should be below 120/80. The first ("top") number is called the systolic pressure. It is a measure of the pressure in your arteries as your heart beats. The second ("bottom") number is called the diastolic pressure. It is a measure of the pressure in your arteries as the heart relaxes. What are the causes? The exact cause of this condition is not known. There are some conditions that result in high blood pressure. What increases the risk? Certain factors may make you more likely to develop high blood pressure. Some of these risk factors are under your control, including: Smoking. Not getting enough exercise or physical activity. Being overweight. Having too much fat, sugar, calories, or salt (sodium) in your diet. Drinking too much alcohol. Other risk factors include: Having a personal history of heart disease, diabetes, high cholesterol, or kidney disease. Stress. Having a family history of high blood pressure and high cholesterol. Having obstructive sleep apnea. Age. The risk increases with age. What are the signs or symptoms? High blood pressure may not cause symptoms. Very high blood pressure (hypertensive crisis) may cause: Headache. Fast or  irregular heartbeats (palpitations). Shortness of breath. Nosebleed. Nausea and vomiting. Vision changes. Severe chest pain, dizziness, and seizures. How is this diagnosed? This condition is diagnosed by measuring your blood pressure while you are seated, with your arm resting on a flat surface, your legs uncrossed, and your feet flat on the floor. The cuff of the blood pressure monitor will be placed directly against the skin of your upper arm at the level of your heart. Blood pressure should be measured at least twice using the same arm. Certain conditions can cause a difference in blood pressure between your right and left arms. If you have a high blood pressure reading during one visit or you have normal blood pressure with other risk factors, you may be asked to: Return on a different day to have your blood pressure checked again. Monitor your blood pressure at home for 1 week or longer. If you are diagnosed with hypertension, you may have other blood or imaging tests to help your health care provider understand your overall risk for other conditions. How is this treated? This condition is treated by making healthy lifestyle changes, such as eating healthy foods, exercising more, and reducing your alcohol intake. You may be referred for counseling on a healthy diet and physical activity. Your health care provider may prescribe medicine if lifestyle changes are not enough to get your blood pressure under control and if: Your systolic blood pressure is above 130. Your diastolic blood pressure is above 80. Your personal target blood pressure may vary depending on your medical conditions, your age, and other factors. Follow these instructions at home: Eating and drinking  Eat a diet that is high in fiber and potassium,  and low in sodium, added sugar, and fat. An example of this eating plan is called the DASH diet. DASH stands for Dietary Approaches to Stop Hypertension. To eat this way: Eat  plenty of fresh fruits and vegetables. Try to fill one half of your plate at each meal with fruits and vegetables. Eat whole grains, such as whole-wheat pasta, brown rice, or whole-grain bread. Fill about one fourth of your plate with whole grains. Eat or drink low-fat dairy products, such as skim milk or low-fat yogurt. Avoid fatty cuts of meat, processed or cured meats, and poultry with skin. Fill about one fourth of your plate with lean proteins, such as fish, chicken without skin, beans, eggs, or tofu. Avoid pre-made and processed foods. These tend to be higher in sodium, added sugar, and fat. Reduce your daily sodium intake. Many people with hypertension should eat less than 1,500 mg of sodium a day. Do not drink alcohol if: Your health care provider tells you not to drink. You are pregnant, may be pregnant, or are planning to become pregnant. If you drink alcohol: Limit how much you have to: 0-1 drink a day for women. 0-2 drinks a day for men. Know how much alcohol is in your drink. In the [place] one drink equals one 12 oz bottle of beer (355 mL), one 5 oz glass of wine (148 mL), or one 1 oz glass of hard liquor (44 mL). Lifestyle  Work with your health care provider to maintain a healthy body weight or to lose weight. Ask what an ideal weight is for you. Get at least 30 minutes of exercise that causes your heart to beat faster (aerobic exercise) most days of the week. Activities may include walking, swimming, or biking. Include exercise to strengthen your muscles (resistance exercise), such as Pilates or lifting weights, as part of your weekly exercise routine. Try to do these types of exercises for 30 minutes at least 3 days a week. Do not use any products that contain nicotine or tobacco. These products include cigarettes, chewing tobacco, and vaping devices, such as e-cigarettes. If you need help quitting, ask your health care provider. Monitor your blood pressure at home as told by  your health care provider. Keep all follow-up visits. This is important. Medicines Take over-the-counter and prescription medicines only as told by your health care provider. Follow directions carefully. Blood pressure medicines must be taken as prescribed. Do not skip doses of blood pressure medicine. Doing this puts you at risk for problems and can make the medicine less effective. Ask your health care provider about side effects or reactions to medicines that you should watch for. Contact a health care provider if you: Think you are having a reaction to a medicine you are taking. Have headaches that keep coming back (recurring). Feel dizzy. Have swelling in your ankles. Have trouble with your vision. Get help right away if you: Develop a severe headache or confusion. Have unusual weakness or numbness. Feel faint. Have severe pain in your chest or abdomen. Vomit repeatedly. Have trouble breathing. These symptoms may be an emergency. Get help right away. Call 911. Do not wait to see if the symptoms will go away. Do not drive yourself to the hospital. Summary Hypertension is when the force of blood pumping through your arteries is too strong. If this condition is not controlled, it may put you at risk for serious complications. Your personal target blood pressure may vary depending on your medical conditions, your age, and other  factors. For most people, a normal blood pressure is less than 120/80. Hypertension is treated with lifestyle changes, medicines, or a combination of both. Lifestyle changes include losing weight, eating a healthy, low-sodium diet, exercising more, and limiting alcohol. This information is not intended to replace advice given to you by your health care provider. Make sure you discuss any questions you have with your health care provider. Document Revised: 10/24/2021 Document Reviewed: 10/24/2021 Elsevier Patient Education  2024 [employer].

## 2024-06-03 NOTE — Assessment & Plan Note (Signed)
 Multifactorial.  Differential diagnosis discussed. Amlodipine  causes ankle/feet edema Patient has some signs of chronic venous insufficiency Chronic EtOH intake with chronic transaminitis.  Chronic liver disease Recommend liver ultrasound

## 2024-06-03 NOTE — Assessment & Plan Note (Signed)
 Diet and nutrition discussed Benefits of exercise discussed Lipid profile done today

## 2024-06-04 ENCOUNTER — Ambulatory Visit: Payer: Self-pay | Admitting: Emergency Medicine

## 2024-06-04 ENCOUNTER — Encounter: Payer: Self-pay | Admitting: Emergency Medicine

## 2024-06-04 DIAGNOSIS — R7401 Elevation of levels of liver transaminase levels: Secondary | ICD-10-CM

## 2024-06-04 LAB — COMPREHENSIVE METABOLIC PANEL WITH GFR
ALT: 100 U/L — ABNORMAL HIGH (ref 0–53)
AST: 115 U/L — ABNORMAL HIGH (ref 0–37)
Albumin: 4.7 g/dL (ref 3.5–5.2)
Alkaline Phosphatase: 35 U/L — ABNORMAL LOW (ref 39–117)
BUN: 13 mg/dL (ref 6–23)
CO2: 22 meq/L (ref 19–32)
Calcium: 10.1 mg/dL (ref 8.4–10.5)
Chloride: 99 meq/L (ref 96–112)
Creatinine, Ser: 0.96 mg/dL (ref 0.40–1.50)
GFR: 90.94 mL/min (ref >60.00)
Glucose, Bld: 70 mg/dL (ref 70–99)
Potassium: 4.2 meq/L (ref 3.5–5.1)
Sodium: 135 meq/L (ref 135–145)
Total Bilirubin: 0.5 mg/dL (ref 0.2–1.2)
Total Protein: 8.3 g/dL (ref 6.0–8.3)

## 2024-06-04 LAB — HEMOGLOBIN A1C: Hgb A1c MFr Bld: 6 % (ref 4.6–6.5)

## 2024-06-04 LAB — CBC WITH DIFFERENTIAL/PLATELET
Basophils Absolute: 0.1 10*3/uL (ref 0.0–0.1)
Basophils Relative: 1.2 % (ref 0.0–3.0)
Eosinophils Absolute: 0.1 10*3/uL (ref 0.0–0.7)
Eosinophils Relative: 1.5 % (ref 0.0–5.0)
HCT: 39.3 % (ref 39.0–52.0)
Hemoglobin: 13.1 g/dL (ref 13.0–17.0)
Lymphocytes Relative: 27.5 % (ref 12.0–46.0)
Lymphs Abs: 1.7 10*3/uL (ref 0.7–4.0)
MCHC: 33.4 g/dL (ref 30.0–36.0)
MCV: 98.9 fl (ref 78.0–100.0)
Monocytes Absolute: 0.9 10*3/uL (ref 0.1–1.0)
Monocytes Relative: 14.5 % — ABNORMAL HIGH (ref 3.0–12.0)
Neutro Abs: 3.4 10*3/uL (ref 1.4–7.7)
Neutrophils Relative %: 55.3 % (ref 43.0–77.0)
Platelets: 228 10*3/uL (ref 150.0–400.0)
RBC: 3.98 Mil/uL — ABNORMAL LOW (ref 4.22–5.81)
RDW: 13.4 % (ref 11.5–15.5)
WBC: 6.2 10*3/uL (ref 4.0–10.5)

## 2024-06-04 LAB — LIPID PANEL
Cholesterol: 235 mg/dL — ABNORMAL HIGH (ref 0–200)
HDL: 88.7 mg/dL (ref >39.00)
LDL Cholesterol: 133 mg/dL — ABNORMAL HIGH (ref 0–99)
NonHDL: 146.7
Total CHOL/HDL Ratio: 3
Triglycerides: 70 mg/dL (ref 0.0–149.0)
VLDL: 14 mg/dL (ref 0.0–40.0)

## 2024-06-16 ENCOUNTER — Ambulatory Visit: Payer: Self-pay | Admitting: Emergency Medicine

## 2024-06-19 ENCOUNTER — Other Ambulatory Visit: Payer: Self-pay | Admitting: Emergency Medicine

## 2024-06-19 DIAGNOSIS — N529 Male erectile dysfunction, unspecified: Secondary | ICD-10-CM

## 2025-01-29 ENCOUNTER — Other Ambulatory Visit: Payer: Self-pay | Admitting: Emergency Medicine

## 2025-01-29 DIAGNOSIS — N529 Male erectile dysfunction, unspecified: Secondary | ICD-10-CM
